# Patient Record
Sex: Female | Born: 1955 | Race: White | Hispanic: No | Smoking: Former smoker
Health system: Southern US, Community
[De-identification: ages and names within clinical notes are randomized; demographics above are authoritative.]

## PROBLEM LIST (undated history)

## (undated) DIAGNOSIS — F419 Anxiety disorder, unspecified: Secondary | ICD-10-CM

## (undated) DIAGNOSIS — J90 Pleural effusion, not elsewhere classified: Secondary | ICD-10-CM

## (undated) DIAGNOSIS — I10 Essential (primary) hypertension: Secondary | ICD-10-CM

## (undated) DIAGNOSIS — I5033 Acute on chronic diastolic (congestive) heart failure: Secondary | ICD-10-CM

## (undated) DIAGNOSIS — I4891 Unspecified atrial fibrillation: Secondary | ICD-10-CM

## (undated) HISTORY — DX: Anxiety disorder, unspecified: F41.9

## (undated) HISTORY — DX: Essential (primary) hypertension: I10

## (undated) HISTORY — PX: WISDOM TOOTH EXTRACTION: SHX21

---
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
## 2019-01-21 DIAGNOSIS — I5033 Acute on chronic diastolic (congestive) heart failure: Secondary | ICD-10-CM

## 2019-01-21 DIAGNOSIS — I4891 Unspecified atrial fibrillation: Secondary | ICD-10-CM

## 2019-01-21 HISTORY — DX: Unspecified atrial fibrillation: I48.91

## 2019-01-21 HISTORY — DX: Acute on chronic diastolic (congestive) heart failure: I50.33

## 2019-02-12 ENCOUNTER — Emergency Department (HOSPITAL_COMMUNITY): Payer: BC Managed Care – PPO

## 2019-02-12 ENCOUNTER — Inpatient Hospital Stay (HOSPITAL_COMMUNITY)
Admission: EM | Admit: 2019-02-12 | Discharge: 2019-02-24 | DRG: 193 | Disposition: A | Discharge status: Home / Self Care | Payer: BC Managed Care – PPO | Attending: Internal Medicine | Admitting: Internal Medicine

## 2019-02-12 ENCOUNTER — Other Ambulatory Visit: Payer: Self-pay

## 2019-02-12 DIAGNOSIS — I110 Hypertensive heart disease with heart failure: Secondary | ICD-10-CM | POA: Diagnosis present

## 2019-02-12 DIAGNOSIS — J811 Chronic pulmonary edema: Secondary | ICD-10-CM | POA: Diagnosis not present

## 2019-02-12 DIAGNOSIS — J189 Pneumonia, unspecified organism: Secondary | ICD-10-CM | POA: Diagnosis present

## 2019-02-12 DIAGNOSIS — I5032 Chronic diastolic (congestive) heart failure: Secondary | ICD-10-CM | POA: Diagnosis present

## 2019-02-12 DIAGNOSIS — F17200 Nicotine dependence, unspecified, uncomplicated: Secondary | ICD-10-CM

## 2019-02-12 DIAGNOSIS — I5033 Acute on chronic diastolic (congestive) heart failure: Secondary | ICD-10-CM | POA: Diagnosis present

## 2019-02-12 DIAGNOSIS — I10 Essential (primary) hypertension: Secondary | ICD-10-CM | POA: Diagnosis not present

## 2019-02-12 DIAGNOSIS — I4891 Unspecified atrial fibrillation: Secondary | ICD-10-CM | POA: Diagnosis present

## 2019-02-12 DIAGNOSIS — R609 Edema, unspecified: Secondary | ICD-10-CM

## 2019-02-12 DIAGNOSIS — Z6837 Body mass index (BMI) 37.0-37.9, adult: Secondary | ICD-10-CM | POA: Diagnosis not present

## 2019-02-12 DIAGNOSIS — F419 Anxiety disorder, unspecified: Secondary | ICD-10-CM | POA: Diagnosis not present

## 2019-02-12 DIAGNOSIS — E669 Obesity, unspecified: Secondary | ICD-10-CM | POA: Diagnosis present

## 2019-02-12 DIAGNOSIS — R0600 Dyspnea, unspecified: Secondary | ICD-10-CM | POA: Diagnosis present

## 2019-02-12 DIAGNOSIS — R0989 Other specified symptoms and signs involving the circulatory and respiratory systems: Secondary | ICD-10-CM | POA: Diagnosis not present

## 2019-02-12 DIAGNOSIS — J181 Lobar pneumonia, unspecified organism: Secondary | ICD-10-CM | POA: Diagnosis not present

## 2019-02-12 DIAGNOSIS — J918 Pleural effusion in other conditions classified elsewhere: Secondary | ICD-10-CM | POA: Diagnosis not present

## 2019-02-12 DIAGNOSIS — Z9114 Patient's other noncompliance with medication regimen: Secondary | ICD-10-CM

## 2019-02-12 DIAGNOSIS — Z20828 Contact with and (suspected) exposure to other viral communicable diseases: Secondary | ICD-10-CM | POA: Diagnosis present

## 2019-02-12 DIAGNOSIS — I2729 Other secondary pulmonary hypertension: Secondary | ICD-10-CM | POA: Diagnosis present

## 2019-02-12 DIAGNOSIS — R601 Generalized edema: Secondary | ICD-10-CM | POA: Diagnosis not present

## 2019-02-12 DIAGNOSIS — I4819 Other persistent atrial fibrillation: Secondary | ICD-10-CM | POA: Diagnosis present

## 2019-02-12 DIAGNOSIS — F17210 Nicotine dependence, cigarettes, uncomplicated: Secondary | ICD-10-CM | POA: Diagnosis present

## 2019-02-12 DIAGNOSIS — J90 Pleural effusion, not elsewhere classified: Secondary | ICD-10-CM | POA: Diagnosis present

## 2019-02-12 DIAGNOSIS — Z79899 Other long term (current) drug therapy: Secondary | ICD-10-CM

## 2019-02-12 DIAGNOSIS — R0602 Shortness of breath: Secondary | ICD-10-CM | POA: Diagnosis not present

## 2019-02-12 DIAGNOSIS — F410 Panic disorder [episodic paroxysmal anxiety] without agoraphobia: Secondary | ICD-10-CM | POA: Diagnosis present

## 2019-02-12 DIAGNOSIS — Z66 Do not resuscitate: Secondary | ICD-10-CM | POA: Diagnosis present

## 2019-02-12 DIAGNOSIS — Z23 Encounter for immunization: Secondary | ICD-10-CM | POA: Diagnosis not present

## 2019-02-12 DIAGNOSIS — I4811 Longstanding persistent atrial fibrillation: Secondary | ICD-10-CM | POA: Diagnosis present

## 2019-02-12 DIAGNOSIS — I272 Pulmonary hypertension, unspecified: Secondary | ICD-10-CM | POA: Diagnosis not present

## 2019-02-12 HISTORY — DX: Pleural effusion, not elsewhere classified: J90

## 2019-02-12 LAB — URINALYSIS, ROUTINE W REFLEX MICROSCOPIC
Bilirubin Urine: NEGATIVE
Glucose, UA: NEGATIVE mg/dL
Hgb urine dipstick: NEGATIVE
Ketones, ur: NEGATIVE mg/dL
Leukocytes,Ua: NEGATIVE
Nitrite: NEGATIVE
Protein, ur: 100 mg/dL — AB
Specific Gravity, Urine: 1.039 — ABNORMAL HIGH (ref 1.005–1.030)
pH: 5 (ref 5.0–8.0)

## 2019-02-12 LAB — CBC WITH DIFFERENTIAL/PLATELET
Abs Immature Granulocytes: 0.08 10*3/uL — ABNORMAL HIGH (ref 0.00–0.07)
Basophils Absolute: 0.1 10*3/uL (ref 0.0–0.1)
Basophils Relative: 0 %
Eosinophils Absolute: 0 10*3/uL (ref 0.0–0.5)
Eosinophils Relative: 0 %
HCT: 55.2 % — ABNORMAL HIGH (ref 36.0–46.0)
Hemoglobin: 17.3 g/dL — ABNORMAL HIGH (ref 12.0–15.0)
Immature Granulocytes: 1 %
Lymphocytes Relative: 10 %
Lymphs Abs: 1.6 10*3/uL (ref 0.7–4.0)
MCH: 30.1 pg (ref 26.0–34.0)
MCHC: 31.3 g/dL (ref 30.0–36.0)
MCV: 96.2 fL (ref 80.0–100.0)
Monocytes Absolute: 1.3 10*3/uL — ABNORMAL HIGH (ref 0.1–1.0)
Monocytes Relative: 8 %
Neutro Abs: 13.3 10*3/uL — ABNORMAL HIGH (ref 1.7–7.7)
Neutrophils Relative %: 81 %
Platelets: 279 10*3/uL (ref 150–400)
RBC: 5.74 MIL/uL — ABNORMAL HIGH (ref 3.87–5.11)
RDW: 15.7 % — ABNORMAL HIGH (ref 11.5–15.5)
WBC: 16.4 10*3/uL — ABNORMAL HIGH (ref 4.0–10.5)
nRBC: 0 % (ref 0.0–0.2)

## 2019-02-12 LAB — COMPREHENSIVE METABOLIC PANEL
ALT: 34 U/L (ref 0–44)
AST: 34 U/L (ref 15–41)
Albumin: 3 g/dL — ABNORMAL LOW (ref 3.5–5.0)
Alkaline Phosphatase: 73 U/L (ref 38–126)
Anion gap: 12 (ref 5–15)
BUN: 18 mg/dL (ref 8–23)
CO2: 25 mmol/L (ref 22–32)
Calcium: 8.6 mg/dL — ABNORMAL LOW (ref 8.9–10.3)
Chloride: 106 mmol/L (ref 98–111)
Creatinine, Ser: 1.16 mg/dL — ABNORMAL HIGH (ref 0.44–1.00)
GFR calc Af Amer: 58 mL/min — ABNORMAL LOW (ref >60)
GFR calc non Af Amer: 50 mL/min — ABNORMAL LOW (ref >60)
Glucose, Bld: 122 mg/dL — ABNORMAL HIGH (ref 70–99)
Potassium: 4 mmol/L (ref 3.5–5.1)
Sodium: 143 mmol/L (ref 135–145)
Total Bilirubin: 1 mg/dL (ref 0.3–1.2)
Total Protein: 6.1 g/dL — ABNORMAL LOW (ref 6.5–8.1)

## 2019-02-12 LAB — SARS CORONAVIRUS 2 BY RT PCR (HOSPITAL ORDER, PERFORMED IN ~~LOC~~ HOSPITAL LAB): SARS Coronavirus 2: NEGATIVE

## 2019-02-12 LAB — POCT I-STAT EG7
Bicarbonate: 28.9 mmol/L — ABNORMAL HIGH (ref 20.0–28.0)
Calcium, Ion: 1.13 mmol/L — ABNORMAL LOW (ref 1.15–1.40)
HCT: 56 % — ABNORMAL HIGH (ref 36.0–46.0)
Hemoglobin: 19 g/dL — ABNORMAL HIGH (ref 12.0–15.0)
O2 Saturation: 90 %
Potassium: 4.1 mmol/L (ref 3.5–5.1)
Sodium: 144 mmol/L (ref 135–145)
TCO2: 31 mmol/L (ref 22–32)
pCO2, Ven: 60.4 mmHg — ABNORMAL HIGH (ref 44.0–60.0)
pH, Ven: 7.289 (ref 7.250–7.430)
pO2, Ven: 68 mmHg — ABNORMAL HIGH (ref 32.0–45.0)

## 2019-02-12 LAB — TROPONIN I (HIGH SENSITIVITY)
Troponin I (High Sensitivity): 40 ng/L — ABNORMAL HIGH (ref <18)
Troponin I (High Sensitivity): 30 ng/L — ABNORMAL HIGH (ref <18)

## 2019-02-12 LAB — BRAIN NATRIURETIC PEPTIDE: B Natriuretic Peptide: 1432.9 pg/mL — ABNORMAL HIGH (ref 0.0–100.0)

## 2019-02-12 LAB — PROTIME-INR
INR: 1.2 (ref 0.8–1.2)
INR: 1.3 — ABNORMAL HIGH (ref 0.8–1.2)
Prothrombin Time: 15.2 seconds (ref 11.4–15.2)
Prothrombin Time: 15.7 seconds — ABNORMAL HIGH (ref 11.4–15.2)

## 2019-02-12 LAB — PHOSPHORUS: Phosphorus: 4.9 mg/dL — ABNORMAL HIGH (ref 2.5–4.6)

## 2019-02-12 LAB — LACTIC ACID, PLASMA
Lactic Acid, Venous: 1.5 mmol/L (ref 0.5–1.9)
Lactic Acid, Venous: 1.2 mmol/L (ref 0.5–1.9)

## 2019-02-12 LAB — TSH: TSH: 1.652 u[IU]/mL (ref 0.350–4.500)

## 2019-02-12 LAB — MAGNESIUM: Magnesium: 2.1 mg/dL (ref 1.7–2.4)

## 2019-02-12 MED ORDER — DILTIAZEM LOAD VIA INFUSION
10.0000 mg | Freq: Once | INTRAVENOUS | Status: AC
  Administered 2019-02-12: 10 mg via INTRAVENOUS
  Filled 2019-02-12: qty 10

## 2019-02-12 MED ORDER — DILTIAZEM HCL-DEXTROSE 100-5 MG/100ML-% IV SOLN (PREMIX)
5.0000 mg/h | INTRAVENOUS | Status: DC
  Administered 2019-02-12: 5 mg/h via INTRAVENOUS
  Administered 2019-02-13 (×3): 15 mg/h via INTRAVENOUS
  Filled 2019-02-12 (×4): qty 100

## 2019-02-12 MED ORDER — SODIUM CHLORIDE 0.9 % IV SOLN
1.0000 g | Freq: Once | INTRAVENOUS | Status: AC
  Administered 2019-02-12: 1 g via INTRAVENOUS
  Filled 2019-02-12: qty 10

## 2019-02-12 MED ORDER — SODIUM CHLORIDE 0.9 % IV SOLN
1.0000 g | INTRAVENOUS | Status: DC
  Administered 2019-02-13 – 2019-02-14 (×2): 1 g via INTRAVENOUS
  Filled 2019-02-12 (×2): qty 10

## 2019-02-12 MED ORDER — SENNOSIDES-DOCUSATE SODIUM 8.6-50 MG PO TABS: 1.0000 | ORAL_TABLET | Freq: Every evening | ORAL | Status: DC | PRN

## 2019-02-12 MED ORDER — FUROSEMIDE 10 MG/ML IJ SOLN
40.0000 mg | Freq: Two times a day (BID) | INTRAMUSCULAR | Status: DC
  Administered 2019-02-13 – 2019-02-23 (×21): 40 mg via INTRAVENOUS
  Filled 2019-02-12 (×21): qty 4

## 2019-02-12 MED ORDER — ACETAMINOPHEN 325 MG PO TABS: 650.0000 mg | ORAL_TABLET | Freq: Four times a day (QID) | ORAL | Status: DC | PRN

## 2019-02-12 MED ORDER — FUROSEMIDE 20 MG PO TABS
40.0000 mg | ORAL_TABLET | Freq: Once | ORAL | Status: DC
  Filled 2019-02-12: qty 2

## 2019-02-12 MED ORDER — ACETAMINOPHEN 650 MG RE SUPP: 650.0000 mg | Freq: Four times a day (QID) | RECTAL | Status: DC | PRN

## 2019-02-12 MED ORDER — IOHEXOL 350 MG/ML SOLN
75.0000 mL | Freq: Once | INTRAVENOUS | Status: AC | PRN
  Administered 2019-02-12: 75 mL via INTRAVENOUS

## 2019-02-12 MED ORDER — ENOXAPARIN SODIUM 40 MG/0.4ML ~~LOC~~ SOLN
40.0000 mg | SUBCUTANEOUS | Status: DC
  Administered 2019-02-13 – 2019-02-14 (×2): 40 mg via SUBCUTANEOUS
  Filled 2019-02-12 (×2): qty 0.4

## 2019-02-12 MED ORDER — FUROSEMIDE 10 MG/ML IJ SOLN
40.0000 mg | Freq: Once | INTRAMUSCULAR | Status: AC
  Administered 2019-02-12: 40 mg via INTRAVENOUS
  Filled 2019-02-12: qty 4

## 2019-02-12 MED ORDER — AZITHROMYCIN 500 MG PO TABS
500.0000 mg | ORAL_TABLET | Freq: Every day | ORAL | Status: DC
  Administered 2019-02-13 – 2019-02-14 (×2): 500 mg via ORAL
  Filled 2019-02-12 (×2): qty 1

## 2019-02-12 MED ORDER — DILTIAZEM HCL 25 MG/5ML IV SOLN
20.0000 mg | Freq: Once | INTRAVENOUS | Status: AC
  Administered 2019-02-12: 20 mg via INTRAVENOUS
  Filled 2019-02-12: qty 5

## 2019-02-12 MED ORDER — SODIUM CHLORIDE 0.9 % IV SOLN
500.0000 mg | Freq: Once | INTRAVENOUS | Status: AC
  Administered 2019-02-12: 21:00:00 500 mg via INTRAVENOUS
  Filled 2019-02-12: qty 500

## 2019-02-12 MED ORDER — ALBUTEROL SULFATE (2.5 MG/3ML) 0.083% IN NEBU: 2.5000 mg | INHALATION_SOLUTION | Freq: Four times a day (QID) | RESPIRATORY_TRACT | Status: DC | PRN

## 2019-02-12 NOTE — ED Provider Notes (Signed)
[facility] EMERGENCY DEPARTMENT Provider Note   CSN: [id number] Arrival date & time: 02/12/19  1534     History   Chief Complaint Chief Complaint  Patient presents with  . Atrial Fibrillation  . Shortness of Breath    HPI Sharon Acosta is a 63 y.o. female.     HPI Patient reports she is been getting swelling in her ankles and feet for several months.  She has been sitting a lot while working 1 of her jobs.  She does work 2 jobs.  This is gradually been increasing and now she has swelling that includes her abdomen and left breast.  She reports that she is gotten very winded with any activity.  She reports after walking only 10 feet she gets short of breath and has to rest.  She denies she is experiencing any chest pain.  She has not had syncope.  She has not had fever or cough.  She reports that she was prescribed blood pressure medications about a year ago but because of being busy with her jobs once she ran out she did not make an appointment to go back to the doctor.  He finally went back today and was referred to the emergency department.  She does smoke.  She has had to cut back over the past week or 2.  He has no known history of atrial fibrillation or any problems with her heart that she was aware of. No past medical history on file.  There are no active problems to display for this patient.      OB History   No obstetric history on file.      Home Medications    Prior to Admission medications   Not on File    Family History No family history on file.  Social History Social History   Tobacco Use  . Smoking status: Not on file  Substance Use Topics  . Alcohol use: Not on file  . Drug use: Not on file     Allergies   Patient has no known allergies.   Review of Systems Review of Systems 10 Systems reviewed and are negative for acute change except as noted in the HPI.   Physical Exam Updated Vital Signs BP (!) 133/115   Pulse  (!) 31   Temp 98.4 F (36.9 C) (Oral)   Resp (!) 29   Ht 5\' 7"  (1.702 m)   Wt 125.6 kg   SpO2 97%   BMI 43.38 kg/m   Physical Exam Constitutional:      Comments: Alert and nontoxic.  Mild to moderate increased work of breathing at rest.  Speaking in full sentences.  HENT:     Head: Normocephalic and atraumatic.     Mouth/Throat:     Mouth: Mucous membranes are moist.     Pharynx: Oropharynx is clear.  Eyes:     Extraocular Movements: Extraocular movements intact.  Neck:     Musculoskeletal: Neck supple.     Comments: No thyromegaly Cardiovascular:     Comments: Tachycardia irregularly irregular Pulmonary:     Comments: Crackles on the left side occasional expiratory wheeze on the right with decreased breath sounds at the bases.  Left breast has mild diffuse pitting of the lower belly of the breast.  Right breast does not have this.  No obvious masses. Abdominal:     Comments: Abdomen is nontender.  Patient has slight pitting edema of the lower abdominal wall.  Musculoskeletal:  Comments: 3+ pitting edema bilateral lower extremities to above the knees.  Skin:    General: Skin is warm.     Comments: Patient is slightly diaphoretic on the head and neck.  Neurological:     General: No focal deficit present.     Mental Status: She is oriented to person, place, and time.     Coordination: Coordination normal.  Psychiatric:        Mood and Affect: Mood normal.      ED Treatments / Results  Labs (all labs ordered are listed, but only abnormal results are displayed) Labs Reviewed  CULTURE, BLOOD (ROUTINE X 2)  CULTURE, BLOOD (ROUTINE X 2)  COMPREHENSIVE METABOLIC PANEL  BRAIN NATRIURETIC PEPTIDE  LACTIC ACID, PLASMA  LACTIC ACID, PLASMA  CBC WITH DIFFERENTIAL/PLATELET  PROTIME-INR  URINALYSIS, ROUTINE W REFLEX MICROSCOPIC  BLOOD GAS, VENOUS  MAGNESIUM  PHOSPHORUS  TSH  TROPONIN I (HIGH SENSITIVITY)    EKG EKG Interpretation  Date/Time:  Thursday February 12 2019 15:38:59 EDT Ventricular Rate:  135 PR Interval:    QRS Duration: 80 QT Interval:  302 QTC Calculation: 453 R Axis:   4 Text Interpretation:  Atrial fibrillation Borderline low voltage, extremity leads agree, no ld comp Confirmed by Arby BarrettePfeiffer, Zalayah Pizzuto [phone]([id number]) on 02/12/2019 3:48:53 PM   Radiology No results found.  Procedures Procedures (including critical care time) CRITICAL CARE Performed by: Arby BarretteMarcy Fedor Kazmierski   Total critical care time: 30 minutes  Critical care time was exclusive of separately billable procedures and treating other patients.  Critical care was necessary to treat or prevent imminent or life-threatening deterioration.  Critical care was time spent personally by me on the following activities: development of treatment plan with patient and/or surrogate as well as nursing, discussions with consultants, evaluation of patient's response to treatment, examination of patient, obtaining history from patient or surrogate, ordering and performing treatments and interventions, ordering and review of laboratory studies, ordering and review of radiographic studies, pulse oximetry and re-evaluation of patient's condition. Medications Ordered in ED Medications - No data to display   Initial Impression / Assessment and Plan / ED Course  I have reviewed the triage vital signs and the nursing notes.  Pertinent labs & imaging results that were available during my care of the patient were reviewed by me and considered in my medical decision making (see chart for details).  Clinical Course as of Feb 16 721  Thu Feb 12, 2019  2037 Consult: Internal medicine for admission.   [MP]    Clinical Course User Index [MP] Arby BarrettePfeiffer, Gavynn Duvall, MD      Patient presents with atrial fibrillation rapid response and peripheral edema.  Has been incrementally developing.  Chest x-ray showed moderately large effusion and consolidation of left lower lobe.  CT scan obtained to rule out PE.  No PE  present.  Will initiate treatment for community-acquired pneumonia and atrial fibrillation with peripheral edema suspect element of CHF.  Antibiotics initiated.  Cardizem bolus initiated admission to medical service.  Final Clinical Impressions(s) / ED Diagnoses   Final diagnoses:  Pneumonia  Atrial fibrillation with rapid ventricular response (HCC)  Dyspnea, unspecified type  Edema, unspecified type    ED Discharge Orders    None       Arby BarrettePfeiffer, Marvel Mcphillips, MD 02/17/19 [id number]

## 2019-02-12 NOTE — H&P (Addendum)
Date: 02/12/2019               Patient Name:  Sharon Acosta MRN: [id number]  DOB: 06-19-1956 Age / Sex: 63 y.o., female   PCP: [facility]         Medical Service: Internal Medicine Teaching Service         Attending Physician: Dr. Arby BarrettePfeiffer, Marcy, MD    First Contact: Dr. Thurmon FairSteen, Jeff Pager: [phone]  Second Contact: Dr. Ginette OttoMelvin, Alec Pager: [phone]       After Hours (After 5p/  First Contact Pager: [phone]  weekends / holidays): Second Contact Pager: [phone]   Chief Complaint: Shortness of breath  History of Present Illness:  Sharon Acosta is a 63 yo F w/ PMH of HTN [facility] w/ complaints of progressive leg swelling and shortness of breath. She states she was in her usual state of health until 6 months ago when she began to notice dyspnea on exertion without any obvious inciting event.  She mentions that she was diagnosed with hypertension and was started on some antihypertensives but she has not been taking them as prescribed.  She states that at work she has been having significant stressors due to 1 of her coworkers becoming sick and passing away and has been having longer hours with more responsibilities.  She mentions that at baseline she was able to ambulate without difficulty greater than 3 blocks but over the last 6 months she has been having worsening dyspnea on exertion and now has difficulty going to the bathroom.  She states she has not been seeing a 'regular doctor' due to being busy with work. She did establish care with [facility] due to her progressive symptoms who recommended she go to the ED for evaluation.  On review of systems, she mentions endorsing worsening orthopnea, palpitations, and episodes described as 'panic attacks.' She mentions sick contact at work but states she wears appropriate face covering. Denies any fevers, chills, nausea, vomiting, diarrhea, productive sputum. Denies any urinary frequency, dysuria, or  uregency.  In the ED, she was found to have left lower lobe consolidation with leukocytosis as well as BNP of 1432.9 She was started on azithromycin and ceftriaxone. She was also found to have A.fib with RVR and she was started on diltiazem drip. IMTS was consulted for admission.  Meds:  Current Meds  Medication Sig  . ASHWAGANDHA PO Take 1 tablet by mouth daily with breakfast. Stress/Anxiety formulation  . Magnesium 250 MG TABS Take 250 mg by mouth daily with breakfast.  . naproxen sodium (ALEVE) 220 MG tablet Take 220-440 mg by mouth 2 (two) times daily as needed (for headaches or pain).  . Potassium 99 MG TABS Take 99 mg by mouth daily with breakfast.  . pyridOXINE (VITAMIN B-6) 100 MG tablet Take 100 mg by mouth daily with breakfast.   Allergies: Allergies as of 02/12/2019  . (No Known Allergies)   No past medical history on file.  Family History:  Denies any significant cardiac family history  Social History: Works as Production designer, theatre/television/filmmanager at [employer]. Has good family support. 1/2 pack daily smoker. Denies any illicit substance use or alcohol use. Eats a lot of fast food.  Review of Systems: A complete ROS was negative except as per HPI.   Physical Exam: Blood pressure (!) 138/119, pulse 66, temperature 98.4 F (36.9 C), temperature source Oral, resp. rate (!) 24, height 5\' 7"  (1.702 m), weight 125.6 kg, SpO2 97 %.  Gen: Well-developed, obese, NAD Neck: supple, ROM intact, + JVD CV: Irregularly irregular, S1, S2 normal Pulm: Distant breath sound, basilar rales on R side, LLL dullness to percussion  Abd: Soft, BS+, Distended with abdominal wall edema Extm: ROM intact, Peripheral pulses intact, 3+ pitting edema up to thighs Skin: Dry, Warm, normal turgor, erythematous left breast without edema or tenderness  EKG: personally reviewed my interpretation is irregularly irregular, normal axis, no ST changes, no T-wave inversions.  CXR: personally reviewed my interpretation is left lower  lobe consolidation and left pleural effusion.   Assessment & Plan by Problem: Active Problems:   * No active hospital problems. *  Sharon Acosta is 63 yo F w/ PMH of HTN admit for anasarca with dyspnea likely due to heart failure exacerbation with community acquired pneumonia. Disease script of progressively worsening edema with dyspnea on exertion with poor health follow up suggests undiagnosed heart failure with inadequate treatment as likely source of her symptoms. Her chest X-ray is concerning for pneumonia as bilateral consolidation / pleural effusion would be expected and she may also have concurrent community acquired pneumonia. Dry weight is unknown but significantly hypervolemic on exam. She will need inpatient admission for further work-up and IV diuresis.  Dyspnea 2/2 CAP vs Pulmonary Edema X-ray w/ LLL lobar consolidation. CTA chest w/ dense LLL consolidation with ground-glass opacities. WBC 16.4 w/ left shift Currently satting 99 on 2L. No oxygen use at home. Started on azithromycin and ceftriaxone in ED. - C/w Azithromycin 500mg  daily, ceftriaxone 1mg  IV daily (day 1 of 5) - Keep O2 sat >88 - Diuresis as described below - Will need thoracentesis for unilateral pleural effusion  Anasarca 2/2 likely undiagnosed heart failure BNP 1433 on admit. No prior cardic hx but has not followed with PCP in the past. Class 4 NYHA with symptomatic dyspnea at rest. Unclear dry weight. Not on diuretics at home. Creatinine 1.16. - Echocardiogram - Start IV Furosemide 40mg  BID - Trend BMP - Mag level - Strict I&Os - Daily Weights - Fluid restriction - Keep O2 sat >88 - Replenish K as needed >4.0  A.Fib w/ RVR EKG with irregularly irregular rhythm with HR >130. With concurrent anasarca, she would be high risk for developing A.fib if she also has R. Atrial enlargement. CHAD-VAS2 score of 2 or 3 depending on results of echo. Currently on diltiazem drip from ED - C/w dilt drip - Monitor bp for  hypotension - Will require anti-coagulation initiation prior to discharge  HTN Admit BP 129/109. Currently started on dilt drip for A.fib - Monitor  DVT prophx: Lovenox Diet: Low salt Bowel: Senokot Code: DNR  Dispo: Admit patient to Inpatient with expected length of stay greater than 2 midnights.  Signed: Mosetta Anis, MD 02/12/2019, 8:43 PM  Pager: [phone]

## 2019-02-12 NOTE — ED Triage Notes (Signed)
Pt from NP office. Pt has been having pitting edema in legs and feet X1 week. Pt reports edema in belly and breast. EMS states pt rate was 150-175. Gave 10mg  metoprolol with some relief 110-140. Pt sob with exertion

## 2019-02-12 NOTE — ED Notes (Signed)
Rn attempted to call report. Asked to call back. 

## 2019-02-12 NOTE — ED Notes (Signed)
ED TO INPATIENT HANDOFF REPORT  ED Nurse Name and Phone #: [phone] Shawna Orleans, RN  S Name/Age/Gender Sharon Acosta 63 y.o. female Room/Bed: 025C/025C  Code Status   Code Status: DNR  Home/SNF/Other Home Patient oriented to: self, place, time and situation Is this baseline? Yes   Triage Complete: Triage complete  Chief Complaint new onset afib rvr  Triage Note Pt from NP office. Pt has been having pitting edema in legs and feet X1 week. Pt reports edema in belly and breast. EMS states pt rate was 150-175. Gave  metoprolol with some relief 110-140. Pt sob with exertion    Allergies No Known Allergies  Level of Care/Admitting Diagnosis ED Disposition    ED Disposition Condition Comment   Admit  Hospital Area: [facility] [[id number]]  Level of Care: Telemetry Medical [104]  Covid Evaluation: Confirmed COVID Negative  Diagnosis: Dyspnea [147829]  Admitting Physician: Nena Polio  Attending Physician: Nena Polio  Estimated length of stay: past midnight tomorrow  Certification:: I certify this patient will need inpatient services for at least 2 midnights  PT Class (Do Not Modify): Inpatient [101]  PT Acc Code (Do Not Modify): Private [1]       B Medical/Surgery History No past medical history on file.    A IV Location/Drains/Wounds Patient Lines/Drains/Airways Status   Active Line/Drains/Airways    Name:   Placement date:   Placement time:   Site:   Days:   Peripheral IV 02/12/19 Left Antecubital   02/12/19    1900    Antecubital   less than 1   Peripheral IV 02/12/19 Right Wrist   02/12/19    2110    Wrist   less than 1          Intake/Output Last 24 hours No intake or output data in the 24 hours ending 02/12/19 2206  Labs/Imaging Results for orders placed or performed during the hospital encounter of 02/12/19 (from the past 48 hour(s))  Comprehensive metabolic panel     Status: Abnormal   Collection Time: 02/12/19   4:03 PM  Result Value Ref Range   Sodium 143 135 - 145 mmol/L   Potassium 4.0 3.5 - 5.1 mmol/L   Chloride 106 98 - 111 mmol/L   CO2 25 22 - 32 mmol/L   Glucose, Bld 122 (H) 70 - 99 mg/dL   BUN 18 8 - 23 mg/dL   Creatinine, Ser 5.62 (H) 0.44 - 1.00 mg/dL   Calcium 8.6 (L) 8.9 - 10.3 mg/dL   Total Protein 6.1 (L) 6.5 - 8.1 g/dL   Albumin 3.0 (L) 3.5 - 5.0 g/dL   AST 34 15 - 41 U/L   ALT 34 0 - 44 U/L   Alkaline Phosphatase 73 38 - 126 U/L   Total Bilirubin 1.0 0.3 - 1.2 mg/dL   GFR calc non Af Amer 50 (L) >60 mL/min   GFR calc Af Amer 58 (L) >60 mL/min   Anion gap 12 5 - 15    Comment: Performed at [facility], [address], [place], [place] [address]  Brain natriuretic peptide     Status: Abnormal   Collection Time: 02/12/19  4:03 PM  Result Value Ref Range   B Natriuretic Peptide 1,432.9 (H) 0.0 - 100.0 pg/mL    Comment: Performed at [facility], [address] [place], [place] [address]  Troponin I (High Sensitivity)     Status: Abnormal   Collection  Time: 02/12/19  4:03 PM  Result Value Ref Range   Troponin I (High Sensitivity) 40 (H) <18 ng/L    Comment: (NOTE) Elevated high sensitivity troponin I (hsTnI) values and significant  changes across serial measurements may suggest ACS but many other  chronic and acute conditions are known to elevate hsTnI results.  Refer to the "Links" section for chest pain algorithms and additional  guidance. Performed at [facility], [place] [address] [place], [place] [address]   CBC with Differential     Status: Abnormal   Collection Time: 02/12/19  4:03 PM  Result Value Ref Range   WBC 16.4 (H) 4.0 - 10.5 K/uL   RBC 5.74 (H) 3.87 - 5.11 MIL/uL   Hemoglobin 17.3 (H) 12.0 - 15.0 g/dL   HCT 55.2 (H) 36.0 - 46.0 %   MCV 96.2 80.0 - 100.0 fL   MCH 30.1 26.0 - 34.0 pg   MCHC 31.3 30.0 - 36.0 g/dL   RDW 15.7 (H) 11.5 - 15.5 %   Platelets 279 150 - 400 K/uL   nRBC 0.0 0.0 - 0.2 %   Neutrophils Relative % 81 %    Neutro Abs 13.3 (H) 1.7 - 7.7 K/uL   Lymphocytes Relative 10 %   Lymphs Abs 1.6 0.7 - 4.0 K/uL   Monocytes Relative 8 %   Monocytes Absolute 1.3 (H) 0.1 - 1.0 K/uL   Eosinophils Relative 0 %   Eosinophils Absolute 0.0 0.0 - 0.5 K/uL   Basophils Relative 0 %   Basophils Absolute 0.1 0.0 - 0.1 K/uL   Immature Granulocytes 1 %   Abs Immature Granulocytes 0.08 (H) 0.00 - 0.07 K/uL    Comment: Performed at [place] [address], [place], [place] [address]  Protime-INR     Status: None   Collection Time: 02/12/19  4:03 PM  Result Value Ref Range   Prothrombin Time 15.2 11.4 - 15.2 seconds    Comment: QUESTIONABLE RESULTS, RECOMMEND RECOLLECT TO VERIFY HCT >QR RECOLLECT TO VERIFY    INR 1.2 0.8 - 1.2    Comment: QUESTIONABLE RESULTS, RECOMMEND RECOLLECT TO VERIFY HCT >55 RECOLLECT TO VERIFY  (NOTE) INR goal varies based on device and disease states. Performed at [facility], [place] [address], [place], [place] [address]   Magnesium     Status: None   Collection Time: 02/12/19  4:03 PM  Result Value Ref Range   Magnesium 2.1 1.7 - 2.4 mg/dL    Comment: Performed at [place] [address], [place], [place] [address]  Phosphorus     Status: Abnormal   Collection Time: 02/12/19  4:03 PM  Result Value Ref Range   Phosphorus 4.9 (H) 2.5 - 4.6 mg/dL    Comment: Performed at [place] [address] [place], [place] [address]  TSH     Status: None   Collection Time: 02/12/19  4:05 PM  Result Value Ref Range   TSH 1.652 0.350 - 4.500 uIU/mL    Comment: Performed by a 3rd Generation assay with a functional sensitivity of <=0.01 uIU/mL. Performed at [facility], [place] [address] [place], [place] [address]   POCT I-Stat EG7     Status: Abnormal   Collection Time: 02/12/19  4:08 PM  Result Value Ref Range   pH, Ven 7.289 7.250 - 7.430   pCO2, Ven 60.4 (H) 44.0 - 60.0 mmHg   pO2, Ven 68.0 (H) 32.0 -  45.0 mmHg   Bicarbonate 28.9 (H) 20.0 -  28.0 mmol/L   TCO2 31 22 - 32 mmol/L   O2 Saturation 90.0 %   Sodium 144 135 - 145 mmol/L   Potassium 4.1 3.5 - 5.1 mmol/L   Calcium, Ion 1.13 (L) 1.15 - 1.40 mmol/L   HCT 56.0 (H) 36.0 - 46.0 %   Hemoglobin 19.0 (H) 12.0 - 15.0 g/dL   Patient temperature HIDE    Sample type VENOUS   Lactic acid, plasma     Status: None   Collection Time: 02/12/19  4:45 PM  Result Value Ref Range   Lactic Acid, Venous 1.5 0.5 - 1.9 mmol/L    Comment: Performed at [facility], [address] [place], [place] [address]  Protime-INR     Status: Abnormal   Collection Time: 02/12/19  5:00 PM  Result Value Ref Range   Prothrombin Time 15.7 (H) 11.4 - 15.2 seconds    Comment: SPECIMEN COLLECTED IN ANTICOAGULANT ADJUSTED TUBE DUE TO ELEVATED HEMATOCRIT   INR 1.3 (H) 0.8 - 1.2    Comment: (NOTE) INR goal varies based on device and disease states. Performed at [facility], [address] [place], [place] [address]   SARS Coronavirus 2 (CEPHEID - Performed in [facility] hospital lab), Hosp Order     Status: None   Collection Time: 02/12/19  5:06 PM   Specimen: Nasopharyngeal Swab  Result Value Ref Range   SARS Coronavirus 2 NEGATIVE NEGATIVE    Comment: (NOTE) If result is NEGATIVE SARS-CoV-2 target nucleic acids are NOT DETECTED. The SARS-CoV-2 RNA is generally detectable in upper and lower  respiratory specimens during the acute phase of infection. The lowest  concentration of SARS-CoV-2 viral copies this assay can detect is 250  copies / mL. A negative result does not preclude SARS-CoV-2 infection  and should not be used as the sole basis for treatment or other  patient management decisions.  A negative result may occur with  improper specimen collection / handling, submission of specimen other  than nasopharyngeal swab, presence of viral mutation(s) within the  areas targeted by this assay, and inadequate number of viral copies  (<250 copies / mL). A negative result must be  combined with clinical  observations, patient history, and epidemiological information. If result is POSITIVE SARS-CoV-2 target nucleic acids are DETECTED. The SARS-CoV-2 RNA is generally detectable in upper and lower  respiratory specimens dur ing the acute phase of infection.  Positive  results are indicative of active infection with SARS-CoV-2.  Clinical  correlation with patient history and other diagnostic information is  necessary to determine patient infection status.  Positive results do  not rule out bacterial infection or co-infection with other viruses. If result is PRESUMPTIVE POSTIVE SARS-CoV-2 nucleic acids MAY BE PRESENT.   A presumptive positive result was obtained on the submitted specimen  and confirmed on repeat testing.  While 2019 novel coronavirus  (SARS-CoV-2) nucleic acids may be present in the submitted sample  additional confirmatory testing may be necessary for epidemiological  and / or clinical management purposes  to differentiate between  SARS-CoV-2 and other Sarbecovirus currently known to infect humans.  If clinically indicated additional testing with an alternate test  methodology [phone]) is advised. The SARS-CoV-2 RNA is generally  detectable in upper and lower respiratory sp ecimens during the acute  phase of infection. The expected result is Negative. Fact Sheet for Patients:  BoilerBrush.com.cy Fact Sheet for Healthcare Providers: https://pope.com/  This test is not yet approved or cleared by the [place] FDA and has been authorized for detection and/or diagnosis of SARS-CoV-2 by FDA under an Emergency Use Authorization (EUA).  This EUA will remain in effect (meaning this test can be used) for the duration of the COVID-19 declaration under Section 564(b)(1) of the Act, 21 U.S.C. section 360bbb-3(b)(1), unless the authorization is terminated or revoked sooner. Performed at [facility], [address] [place], [place] [address]   Lactic acid, plasma     Status: None   Collection Time: 02/12/19  6:03 PM  Result Value Ref Range   Lactic Acid, Venous 1.2 0.5 - 1.9 mmol/L    Comment: Performed at [facility], [address] [place], [place] [address]  Troponin I (High Sensitivity)     Status: Abnormal   Collection Time: 02/12/19  6:03 PM  Result Value Ref Range   Troponin I (High Sensitivity) 30 (H) <18 ng/L    Comment: (NOTE) Elevated high sensitivity troponin I (hsTnI) values and significant  changes across serial measurements may suggest ACS but many other  chronic and acute conditions are known to elevate hsTnI results.  Refer to the "Links" section for chest pain algorithms and additional  guidance. Performed at [facility], [address] [place], [place] [address]   Urinalysis, Routine w reflex microscopic     Status: Abnormal   Collection Time: 02/12/19  8:15 PM  Result Value Ref Range   Color, Urine AMBER (A) YELLOW    Comment: BIOCHEMICALS MAY BE AFFECTED BY COLOR   APPearance HAZY (A) CLEAR   Specific Gravity, Urine 1.039 (H) 1.005 - 1.030   pH 5.0 5.0 - 8.0   Glucose, UA NEGATIVE NEGATIVE mg/dL   Hgb urine dipstick NEGATIVE NEGATIVE   Bilirubin Urine NEGATIVE NEGATIVE   Ketones, ur NEGATIVE NEGATIVE mg/dL   Protein, ur 213100 (A) NEGATIVE mg/dL   Nitrite NEGATIVE NEGATIVE   Leukocytes,Ua NEGATIVE NEGATIVE   RBC / HPF 0-5 0 - 5 RBC/hpf   WBC, UA 0-5 0 - 5 WBC/hpf   Bacteria, UA RARE (A) NONE SEEN   Squamous Epithelial / LPF 0-5 0 - 5   Mucus PRESENT    Hyaline Casts, UA PRESENT     Comment: Performed at [facility], [address] [place], [place] [address]   Ct Angio Chest Pe W/cm &/or Wo Cm  Result Date: 02/12/2019 CLINICAL DATA:  Acute onset of shortness of breath on exertion and pitting edema at the lower extremities. Generalized chest pain. EXAM: CT ANGIOGRAPHY CHEST WITH CONTRAST TECHNIQUE: Multidetector CT imaging of  the chest was performed using the standard protocol during bolus administration of intravenous contrast. Multiplanar CT image reconstructions and MIPs were obtained to evaluate the vascular anatomy. CONTRAST:  75mL OMNIPAQUE IOHEXOL 350 MG/ML SOLN COMPARISON:  Chest radiograph performed earlier today at 4:11 p.m. FINDINGS: Cardiovascular: There is no definite evidence of pulmonary embolus. Evaluation for pulmonary embolus at the left lower lobe is somewhat suboptimal due to surrounding airspace opacification. The heart is borderline prominent. Mild calcification is noted at the aortic arch. Mediastinum/Nodes: No definite mediastinal lymphadenopathy is seen. No pericardial effusion is identified. The visualized portions of the thyroid gland are unremarkable. No axillary lymphadenopathy is seen. Lungs/Pleura: There is a small to moderate left-sided pleural effusion. There is dense consolidation of the left lower lobe, and scattered ground-glass airspace opacity within the left lung. This may reflect asymmetric pulmonary edema  or possibly pneumonia. The left lung appears clear. No pneumothorax is seen. Upper Abdomen: The visualized portions of the liver are unremarkable. The spleen appears diminutive. Musculoskeletal: No acute osseous abnormalities are identified. The visualized musculature is unremarkable in appearance. Soft tissue edema is noted along the anterior chest wall, left breast and mid back. Review of the MIP images confirms the above findings. IMPRESSION: 1. No definite evidence of pulmonary embolus. 2. Dense consolidation of the left lower lobe, scattered ground-glass airspace opacity at the left lung, and small to moderate left-sided pleural effusion. This may reflect asymmetric pulmonary edema or possibly pneumonia. 3. Soft tissue edema along the anterior chest wall, left breast and mid back. Electronically Signed   By: Roanna RaiderJeffery  Chang M.D.   On: 02/12/2019 20:00   Dg Chest Port 1 View  Result Date:  02/12/2019 CLINICAL DATA:  Pt from NP office. Pt has been having pitting edema in legs and feet X1 week. Pt reports edema in belly, legs and breast. Pt c/o SOB w/ exertion EXAM: PORTABLE CHEST 1 VIEW COMPARISON:  None. FINDINGS: Cardiac silhouette partly obscured, likely mildly enlarged. No mediastinal masses. Moderate to large left pleural effusion opacifying the left hemithorax to the level of the left hilum. No right pleural effusion. Lungs show prominent bronchovascular markings. There is opacity adjacent to the left pleural effusion consistent with atelectasis. No convincing pneumonia and no overt pulmonary edema. No pneumothorax. Skeletal structures are grossly intact. IMPRESSION: 1. Moderate to large left pleural effusion with associated atelectasis. 2. No convincing pneumonia or pulmonary edema. Electronically Signed   By: Amie Portlandavid  Ormond M.D.   On: 02/12/2019 16:45    Pending Labs Unresulted Labs (From admission, onward)    Start     Ordered   02/13/19 0500  Basic metabolic panel  Tomorrow morning,   R     02/12/19 2157   02/13/19 0500  CBC  Tomorrow morning,   R     02/12/19 2157   02/12/19 2153  HIV antibody (Routine Testing)  Once,   STAT     02/12/19 2157   02/12/19 2139  Procalcitonin - Baseline  Add-on,   AD     02/12/19 2138   02/12/19 1604  Blood gas, venous  Once,   STAT     02/12/19 1604   02/12/19 1604  Culture, blood (routine x 2)  BLOOD CULTURE X 2,   STAT     02/12/19 1604          Vitals/Pain Today's Vitals   02/12/19 1950 02/12/19 2030 02/12/19 2100 02/12/19 2115  BP:  (!) 141/118  (!) 152/117  Pulse:  90    Resp:  (!) 28 19 20   Temp:      TempSrc:      SpO2:  96%    Weight:      Height:      PainSc: 0-No pain       Isolation Precautions No active isolations  Medications Medications  azithromycin (ZITHROMAX) 500 mg in sodium chloride 0.9 % 250 mL IVPB (500 mg Intravenous New Bag/Given 02/12/19 2121)  diltiazem (CARDIZEM) 1 mg/mL load via infusion 10 mg  (10 mg Intravenous Bolus from Bag 02/12/19 2114)    And  diltiazem (CARDIZEM) 100 mg in dextrose 5% 100mL (1 mg/mL) infusion (5 mg/hr Intravenous New Bag/Given 02/12/19 2111)  enoxaparin (LOVENOX) injection 40 mg (has no administration in time range)  acetaminophen (TYLENOL) tablet 650 mg (has no administration in time range)    Or  acetaminophen (TYLENOL) suppository 650 mg (has no administration in time range)  senna-docusate (Senokot-S) tablet 1 tablet (has no administration in time range)  albuterol (PROVENTIL) (2.5 MG/3ML) 0.083% nebulizer solution 2.5 mg (has no administration in time range)  furosemide (LASIX) injection 40 mg (has no administration in time range)  diltiazem (CARDIZEM) injection 20 mg (20 mg Intravenous Given 02/12/19 1900)  iohexol (OMNIPAQUE) 350 MG/ML injection 75 mL (75 mLs Intravenous Contrast Given 02/12/19 1940)  cefTRIAXone (ROCEPHIN) 1 g in sodium chloride 0.9 % 100 mL IVPB (0 g Intravenous Stopped 02/12/19 2121)    Mobility walks Low fall risk   Focused Assessments Cardiac Assessment Handoff:  Cardiac Rhythm: Atrial fibrillation No results found for: CKTOTAL, CKMB, CKMBINDEX, TROPONINI No results found for: DDIMER Does the Patient currently have chest pain? No      R Recommendations: See Admitting Provider Note  Report given to:   Additional Notes:

## 2019-02-13 ENCOUNTER — Encounter (HOSPITAL_COMMUNITY): Payer: Self-pay

## 2019-02-13 ENCOUNTER — Inpatient Hospital Stay (HOSPITAL_COMMUNITY): Payer: BC Managed Care – PPO

## 2019-02-13 DIAGNOSIS — J90 Pleural effusion, not elsewhere classified: Secondary | ICD-10-CM

## 2019-02-13 DIAGNOSIS — R0989 Other specified symptoms and signs involving the circulatory and respiratory systems: Secondary | ICD-10-CM

## 2019-02-13 DIAGNOSIS — R0602 Shortness of breath: Secondary | ICD-10-CM

## 2019-02-13 LAB — BODY FLUID CELL COUNT WITH DIFFERENTIAL
Eos, Fluid: 0 %
Lymphs, Fluid: 65 %
Monocyte-Macrophage-Serous Fluid: 0 % — ABNORMAL LOW (ref 50–90)
Neutrophil Count, Fluid: 35 % — ABNORMAL HIGH (ref 0–25)
Total Nucleated Cell Count, Fluid: 1337 cu mm — ABNORMAL HIGH (ref 0–1000)

## 2019-02-13 LAB — CBC
HCT: 53.9 % — ABNORMAL HIGH (ref 36.0–46.0)
Hemoglobin: 17.4 g/dL — ABNORMAL HIGH (ref 12.0–15.0)
MCH: 30.3 pg (ref 26.0–34.0)
MCHC: 32.3 g/dL (ref 30.0–36.0)
MCV: 93.9 fL (ref 80.0–100.0)
Platelets: 263 10*3/uL (ref 150–400)
RBC: 5.74 MIL/uL — ABNORMAL HIGH (ref 3.87–5.11)
RDW: 15.6 % — ABNORMAL HIGH (ref 11.5–15.5)
WBC: 16.6 10*3/uL — ABNORMAL HIGH (ref 4.0–10.5)
nRBC: 0 % (ref 0.0–0.2)

## 2019-02-13 LAB — PROTEIN / CREATININE RATIO, URINE
Creatinine, Urine: <10 mg/dL
Total Protein, Urine: <6 mg/dL

## 2019-02-13 LAB — LACTATE DEHYDROGENASE, PLEURAL OR PERITONEAL FLUID: LD, Fluid: 138 U/L — ABNORMAL HIGH (ref 3–23)

## 2019-02-13 LAB — BASIC METABOLIC PANEL
Anion gap: 10 (ref 5–15)
BUN: 11 mg/dL (ref 8–23)
CO2: 13 mmol/L — ABNORMAL LOW (ref 22–32)
Calcium: 8.7 mg/dL — ABNORMAL LOW (ref 8.9–10.3)
Chloride: 120 mmol/L — ABNORMAL HIGH (ref 98–111)
Creatinine, Ser: 0.6 mg/dL (ref 0.44–1.00)
GFR calc Af Amer: >60 mL/min (ref >60)
GFR calc non Af Amer: >60 mL/min (ref >60)
Glucose, Bld: 104 mg/dL — ABNORMAL HIGH (ref 70–99)
Potassium: 3.1 mmol/L — ABNORMAL LOW (ref 3.5–5.1)
Sodium: 143 mmol/L (ref 135–145)

## 2019-02-13 LAB — LIPID PANEL
Cholesterol: 97 mg/dL (ref 0–200)
HDL: 26 mg/dL — ABNORMAL LOW (ref >40)
LDL Cholesterol: 46 mg/dL (ref 0–99)
Total CHOL/HDL Ratio: 3.7 RATIO
Triglycerides: 123 mg/dL (ref <150)
VLDL: 25 mg/dL (ref 0–40)

## 2019-02-13 LAB — ECHOCARDIOGRAM COMPLETE
Height: 67 in
Weight: 4284.8 oz

## 2019-02-13 LAB — HEMOGLOBIN A1C
Hgb A1c MFr Bld: 5.4 % (ref 4.8–5.6)
Mean Plasma Glucose: 108.28 mg/dL

## 2019-02-13 LAB — HIV ANTIBODY (ROUTINE TESTING W REFLEX): HIV Screen 4th Generation wRfx: NONREACTIVE

## 2019-02-13 LAB — STREP PNEUMONIAE URINARY ANTIGEN: Strep Pneumo Urinary Antigen: NEGATIVE

## 2019-02-13 LAB — GLUCOSE, PLEURAL OR PERITONEAL FLUID: Glucose, Fluid: 120 mg/dL

## 2019-02-13 LAB — ALBUMIN, PLEURAL OR PERITONEAL FLUID: Albumin, Fluid: 1.1 g/dL

## 2019-02-13 LAB — PROTEIN, PLEURAL OR PERITONEAL FLUID: Total protein, fluid: <3 g/dL

## 2019-02-13 MED ORDER — POTASSIUM CHLORIDE CRYS ER 20 MEQ PO TBCR
40.0000 meq | EXTENDED_RELEASE_TABLET | Freq: Two times a day (BID) | ORAL | Status: AC
  Administered 2019-02-13 – 2019-02-14 (×2): 40 meq via ORAL
  Filled 2019-02-13: qty 2

## 2019-02-13 MED ORDER — DILTIAZEM HCL 30 MG PO TABS
30.0000 mg | ORAL_TABLET | Freq: Four times a day (QID) | ORAL | Status: DC
  Administered 2019-02-13 – 2019-02-14 (×3): 30 mg via ORAL
  Filled 2019-02-13 (×3): qty 1

## 2019-02-13 MED ORDER — HYDROXYZINE HCL 10 MG PO TABS
10.0000 mg | ORAL_TABLET | Freq: Three times a day (TID) | ORAL | Status: DC | PRN
  Administered 2019-02-13: 10 mg via ORAL
  Filled 2019-02-13: qty 1

## 2019-02-13 MED ORDER — METOPROLOL SUCCINATE ER 25 MG PO TB24
25.0000 mg | ORAL_TABLET | Freq: Every day | ORAL | Status: DC
  Administered 2019-02-13 – 2019-02-19 (×8): 25 mg via ORAL
  Filled 2019-02-13 (×8): qty 1

## 2019-02-13 NOTE — Progress Notes (Signed)
RN updated patient daughter in law, Chrys Racer this morning prior to shift change.

## 2019-02-13 NOTE — Evaluation (Signed)
Physical Therapy Evaluation Patient Details Name: Sharon ShengRebecca Acosta MRN: [id number] DOB: 1955/09/29 Today's Date: 02/13/2019   History of Present Illness  Mrs.Admire is a 63 yo F w/ PMH of HTN [facility] w/ complaints of progressive leg swelling and shortness of breath.  Found to have A-fib w/RVR, CHF with anasarca and PNA.  Clinical Impression  Patient presents with decreased mobility due to decreased activity tolerance, decreased balance and decreased cardiorespiratory reserve.  She currently ambulates with S to minguard A with HR up to 150's and SpO2 94% on 2L O2.  Feel she can benefit from skilled PT in the acute setting to allow return home with family support (reports can stay with son initially if needed.)      Follow Up Recommendations No PT follow up    Equipment Recommendations  Other (comment)(shower seat)    Recommendations for Other Services       Precautions / Restrictions Precautions Precautions: Other (comment) Precaution Comments: watch HR      Mobility  Bed Mobility Overal bed mobility: Modified Independent                Transfers Overall transfer level: Modified independent                  Ambulation/Gait Ambulation/Gait assistance: Supervision;Min guard Gait Distance (Feet): 90 Feet Assistive device: None Gait Pattern/deviations: Step-to pattern;Step-through pattern;Wide base of support     General Gait Details: mildly unsteady, but mostly SOB with HR into 150's  Stairs            Wheelchair Mobility    Modified Rankin (Stroke Patients Only)       Balance Overall balance assessment: Mild deficits observed, not formally tested                                           Pertinent Vitals/Pain Pain Assessment: No/denies pain    Home Living Family/patient expects to be discharged to:: Private residence Living Arrangements: Alone Available Help at Discharge: Family(son lives in town) Type of Home:  House Home Access: Stairs to enter Entrance Stairs-Rails: None Secretary/administratorntrance Stairs-Number of Steps: 5 Home Layout: One level Home Equipment: Cane - single point      Prior Function Level of Independence: Independent         Comments: can stay with son who lives close if need be, his home does have grabbars in shower (pt was working as Research officer, 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[employer])     [employer] Dominance        Extremity/Trunk Assessment   Upper Extremity Assessment Upper Extremity Assessment: Overall WFL for tasks assessed    Lower Extremity Assessment Lower Extremity Assessment: Overall WFL for tasks assessed(pitting edema in lower legs)       Communication   Communication: No difficulties  Cognition Arousal/Alertness: Awake/alert Behavior During Therapy: WFL for tasks assessed/performed Overall Cognitive Status: Within Functional Limits for tasks assessed                                        General Comments General comments (skin integrity, edema, etc.): SpO2 on 2L O2 throughout ambulation 94%, HR up to 150's    Exercises     Assessment/Plan    PT Assessment Patient needs continued PT services  PT Problem List  Cardiopulmonary status limiting activity;Decreased mobility;Decreased activity tolerance;Decreased balance       PT Treatment Interventions Stair training;Therapeutic activities;Balance training;Patient/family education;Therapeutic exercise;Functional mobility training;Gait training    PT Goals (Current goals can be found in the Care Plan section)  Acute Rehab PT Goals Patient Stated Goal: to return to indpendent PT Goal Formulation: With patient Time For Goal Achievement: 02/20/19 Potential to Achieve Goals: Good    Frequency Min 3X/week   Barriers to discharge        Co-evaluation               AM-PAC PT "6 Clicks" Mobility  Outcome Measure Help needed turning from your back to your side while in a flat bed without using bedrails?:  None Help needed moving from lying on your back to sitting on the side of a flat bed without using bedrails?: None Help needed moving to and from a bed to a chair (including a wheelchair)?: None Help needed standing up from a chair using your arms (e.g., wheelchair or bedside chair)?: None Help needed to walk in hospital room?: A Little Help needed climbing 3-5 steps with a railing? : A Little 6 Click Score: 22    End of Session Equipment Utilized During Treatment: Oxygen Activity Tolerance: Patient limited by fatigue Patient left: in chair;with call bell/phone within reach   PT Visit Diagnosis: Difficulty in walking, not elsewhere classified (R26.2)    Time: 3646-8032 PT Time Calculation (min) (ACUTE ONLY): 28 min   Charges:   PT Evaluation $PT Eval Moderate Complexity: 1 Mod PT Treatments $Gait Training: 8-22 mins        Magda Kiel, [place] [phone] 02/13/2019   Reginia [place] 02/13/2019, 12:37 PM

## 2019-02-13 NOTE — Progress Notes (Signed)
Patient has put out nearly 5021ml of urine since admission at 2300.  Given a total of 80mg  of lasix.  RN notified MD.

## 2019-02-13 NOTE — Progress Notes (Signed)
OT Cancellation Note  Patient Details Name: Sharon Acosta MRN: [id number] DOB: Mar 03, 1956   Cancelled Treatment:    Reason Eval/Treat Not Completed: Patient at procedure or test/ unavailable(Thorencentesis)  Merri Ray Kalayna Noy 02/13/2019, 5:32 PM   Hulda Humphrey OTR/L Acute Rehabilitation Services Pager: [phone] Office: [phone]

## 2019-02-13 NOTE — Progress Notes (Signed)
Subjective: Pt seen at the bedside on rounds this AM. Resting in bed on O2 [place] comfortably. She says she has peed a lot since coming into the hospital because of the lasix. Doesn't feel feverish and her breathing is ok. She is okay with doing a thoracentesis to get the fluid off her lungs. Belly doesn't feel swollen to her.   Objective:  Vital signs in last 24 hours: Vitals:   02/13/19 0034 02/13/19 0220 02/13/19 0458 02/13/19 0804  BP: (!) 131/107 (!) 135/108 (!) 137/94 117/81  Pulse: (!) 116 96 (!) 105 (!) 57  Resp:   20 16  Temp:   98.1 F (36.7 C) 98.2 F (36.8 C)  TempSrc:   Oral Oral  SpO2:   97% 96%  Weight:   121.5 kg   Height:       Physical Exam Constitutional:      Appearance: She is obese.  Cardiovascular:     Rate and Rhythm: Tachycardia present. Rhythm irregular.  Pulmonary:     Breath sounds: Examination of the right-upper field reveals rales. Examination of the left-upper field reveals rales. Examination of the right-middle field reveals rales. Examination of the left-middle field reveals decreased breath sounds. Examination of the right-lower field reveals rales. Examination of the left-lower field reveals decreased breath sounds. Decreased breath sounds and rales present.     Comments: 2l O2 Abdominal:     General: Bowel sounds are normal.     Palpations: Abdomen is soft.  Neurological:     Mental Status: She is alert.  Psychiatric:        Mood and Affect: Mood is anxious.      Assessment/Plan:  Active Problems:   Dyspnea  Sharon Acosta is 63 yo F w/ PMH of HTN admit for anasarca with dyspnea likely due to heart failure exacerbation with community acquired pneumonia. Disease script of progressively worsening edema with dyspnea on exertion with poor health follow up suggests undiagnosed heart failure with inadequate treatment as likely source of her symptoms. Her chest X-ray is concerning for pneumonia as bilateral consolidation / pleural effusion would be  expected and she may also have concurrent community acquired pneumonia. Dry weight is unknown but significantly hypervolemic on exam. She will need inpatient admission for further work-up and IV diuresis.  Dyspnea 2/2 CAP ,Pulmonary Edema, Pulmonary Effusion X-ray w/ LLL lobar consolidation. CTA chest w/ dense LLL consolidation with ground-glass opacities. WBC 16.4 w/ left shift Currently satting >92% on 2L. No oxygen use at home. Started on azithromycin and ceftriaxone in ED. Thoracentesis today, no complications, patient given instructions to notify nurse if she becomes short of breath. - C/w Azithromycin 500mg  daily, ceftriaxone 1mg  IV daily (day 1 of 5) - Keep O2 sat >92 - Furosemide 40 mg twice daily - Follow-up thoracentesis results -Strep Pneumon Antigen   Anasarca 2/2 likely undiagnosed heart failure BNP 1433 on admit. No prior cardic hx but has not followed with PCP in the past. Class 4 NYHA with symptomatic dyspnea at rest. Unclear dry weight. Not on diuretics at home. Creatinine 1.16. - F/u echo results - Start IV Furosemide 40mg  BID as above - Strict I&Os - Daily Weights - Fluid restriction - Keep O2 sat >92 - Replenish K as needed >4 - A1c -UP/C ratio - Trend BMP  A.Fib w/ RVR EKG with irregularly irregular rhythm with HR >130. With concurrent anasarca, she would be high risk for developing A.fib if she also has R. Atrial enlargement. CHAD-VAS2 score of 2  or 3 depending on results of echo. Currently on diltiazem drip. - Monitor bp for hypotension - Will require anti-coagulation initiation prior to discharge -PO diltiazem 30 mg every 6 hours and titrate up.  Nurse will stop IV diltiazem 1 hour after giving p.o.  HTN: 130's to 138 systolic today - Monitor  DVT prophx: Lovenox Diet: Low salt Bowel: Senokot Code: DNR  Dispo: Admit patient to Inpatient with expected length of stay greater than 2 midnights.  Thurmon FairJeff Shakima Nisley, MD PGY1  [phone]

## 2019-02-13 NOTE — Plan of Care (Signed)
  Problem: Education: Goal: Knowledge of General Education information will improve Description: Including pain rating scale, medication(s)/side effects and non-pharmacologic comfort measures Outcome: Progressing   Problem: Health Behavior/Discharge Planning: Goal: Ability to manage health-related needs will improve Outcome: Progressing   Problem: Clinical Measurements: Goal: Will remain free from infection Outcome: Progressing   Problem: Nutrition: Goal: Adequate nutrition will be maintained Outcome: Progressing   Problem: Coping: Goal: Level of anxiety will decrease Outcome: Progressing   Problem: Elimination: Goal: Will not experience complications related to urinary retention Outcome: Progressing   Problem: Pain Managment: Goal: General experience of comfort will improve Outcome: Progressing   Problem: Safety: Goal: Ability to remain free from injury will improve Outcome: Progressing   Problem: Skin Integrity: Goal: Risk for impaired skin integrity will decrease Outcome: Progressing   

## 2019-02-13 NOTE — Progress Notes (Signed)
Pt on max titration for cardizem drip with HR ranging from 98-120s.  RN paged imts who gave order for metoprolol PO to be given tonight.

## 2019-02-13 NOTE — Progress Notes (Signed)
  Echocardiogram 2D Echocardiogram has been performed.  Sharon Acosta 02/13/2019, 9:08 AM

## 2019-02-13 NOTE — Progress Notes (Signed)
Patient is labeled a DNR.  Prior to placing DNR band on patient RN verified with patient.  Patient seemed to be confused about what DNR really means.  RN explained to patient and she wishes to discuss with MD in the morning.  Patient would only state "I don't want to be on life support or have my children need to make decisions for me".  RN notified MD.

## 2019-02-13 NOTE — Procedures (Signed)
Thoracentesis Procedure Note  Pre-operative Diagnosis: Left Pleural Effusion  Indications:  Diagnostic and therapeutic.   Procedure Details:  Informed consent was obtained after explanation of the risks and benefits of the procedure, refer to the consent documentation.  Time-out Performed immediately prior to the procedure.  All available chest radiographs were reviewed and the patient was subsequently placed in a sitting/semi-recumbent position.  Using ultrasound guidance a large pleural effusion was noted on the left.  The area was prepped with chlorhexadine and draped in sterile fashion.  Following this 1% lidocaine was injected subcutaneously and deep to provide anesthesia.  A small incision was then made parallel and superior to the rib, the thoracentesis needle with catheter was inserted into the chest wall and advanced under constant aspiration.  Upon aspiration of pleural fluid, the catheter was advanced into the pleural space and the needle was removed.  The catheter was then connected to a drainange bag and the fluid was removed under manual drainage. The catheter was then removed during slow forced exhalation and a sterile bandage was placed.  Findings:   1400 mL of  Light yellow pleural fluid was removed.  Fluid was sent for Cytology, culture , PH, Protein, LDH, Glucose, Cell Count, and Albumin  Condition: The patient tolerated the procedure well and remains in the same condition as pre-procedure.  Complications: No complications during this procedure.   Plan: Post-procedure ultrasound showed appropriate lung sliding, which makes a pneumothorax unlikely. A routine post-procedure xray is not necessary unless the patient develops new symptoms.   Tamsen Snider, MD PGY1  [phone]

## 2019-02-14 ENCOUNTER — Inpatient Hospital Stay (HOSPITAL_COMMUNITY): Payer: BC Managed Care – PPO

## 2019-02-14 ENCOUNTER — Encounter (HOSPITAL_COMMUNITY): Payer: Self-pay

## 2019-02-14 DIAGNOSIS — R0600 Dyspnea, unspecified: Secondary | ICD-10-CM

## 2019-02-14 DIAGNOSIS — F419 Anxiety disorder, unspecified: Secondary | ICD-10-CM

## 2019-02-14 LAB — COMPREHENSIVE METABOLIC PANEL
ALT: 23 U/L (ref 0–44)
AST: 19 U/L (ref 15–41)
Albumin: 2.4 g/dL — ABNORMAL LOW (ref 3.5–5.0)
Alkaline Phosphatase: 63 U/L (ref 38–126)
Anion gap: 13 (ref 5–15)
BUN: 13 mg/dL (ref 8–23)
CO2: 29 mmol/L (ref 22–32)
Calcium: 8.3 mg/dL — ABNORMAL LOW (ref 8.9–10.3)
Chloride: 101 mmol/L (ref 98–111)
Creatinine, Ser: 0.83 mg/dL (ref 0.44–1.00)
GFR calc Af Amer: >60 mL/min (ref >60)
GFR calc non Af Amer: >60 mL/min (ref >60)
Glucose, Bld: 102 mg/dL — ABNORMAL HIGH (ref 70–99)
Potassium: 4.2 mmol/L (ref 3.5–5.1)
Sodium: 143 mmol/L (ref 135–145)
Total Bilirubin: 1.3 mg/dL — ABNORMAL HIGH (ref 0.3–1.2)
Total Protein: 5.7 g/dL — ABNORMAL LOW (ref 6.5–8.1)

## 2019-02-14 LAB — CBC
HCT: 50.4 % — ABNORMAL HIGH (ref 36.0–46.0)
Hemoglobin: 16.3 g/dL — ABNORMAL HIGH (ref 12.0–15.0)
MCH: 30.6 pg (ref 26.0–34.0)
MCHC: 32.3 g/dL (ref 30.0–36.0)
MCV: 94.7 fL (ref 80.0–100.0)
Platelets: 220 10*3/uL (ref 150–400)
RBC: 5.32 MIL/uL — ABNORMAL HIGH (ref 3.87–5.11)
RDW: 15.6 % — ABNORMAL HIGH (ref 11.5–15.5)
WBC: 17.8 10*3/uL — ABNORMAL HIGH (ref 4.0–10.5)
nRBC: 0 % (ref 0.0–0.2)

## 2019-02-14 LAB — LACTATE DEHYDROGENASE: LDH: 210 U/L — ABNORMAL HIGH (ref 98–192)

## 2019-02-14 LAB — MAGNESIUM: Magnesium: 1.9 mg/dL (ref 1.7–2.4)

## 2019-02-14 MED ORDER — APIXABAN 5 MG PO TABS
5.0000 mg | ORAL_TABLET | Freq: Two times a day (BID) | ORAL | Status: DC
  Administered 2019-02-14 – 2019-02-24 (×20): 5 mg via ORAL
  Filled 2019-02-14 (×20): qty 1

## 2019-02-14 MED ORDER — DILTIAZEM HCL 30 MG PO TABS
30.0000 mg | ORAL_TABLET | ORAL | Status: AC
  Administered 2019-02-14: 30 mg via ORAL
  Filled 2019-02-14: qty 1

## 2019-02-14 MED ORDER — DILTIAZEM HCL 60 MG PO TABS
60.0000 mg | ORAL_TABLET | Freq: Four times a day (QID) | ORAL | Status: DC
  Administered 2019-02-14 – 2019-02-15 (×4): 60 mg via ORAL
  Filled 2019-02-14 (×4): qty 1

## 2019-02-14 NOTE — Evaluation (Signed)
Occupational Therapy Evaluation Patient Details Name: Sharon Acosta MRN: [id number] DOB: 1956/05/21 Today's Date: 02/14/2019    History of Present Illness Sharon Acosta is a 63 yo F w/ PMH of HTN presenting to [facility] w/ complaints of progressive leg swelling and shortness of breath.  Found to have A-fib w/RVR, CHF with anasarca and PNA.   Clinical Impression   Pt admitted with above. She demonstrates the below listed deficits and will benefit from continued OT to maximize safety and independence with BADLs.  Pt presents to OT with generalized weakness, decreased activity tolerance.  She is able to perform ADLs at min guard assist level, but fatigues quickly, requiring frequent rest breaks.  02 sats 91% on 2L supplemental 02.  She reports she lives alone and was independent PTA, but acknowledges she was progressively declining for months, but had a significant decline in function over the past 3 weeks PTA.  She plans to stay with her son and his family at discharge.  She may need a hospital bed as they don't have a bed for her to use on the main level, and if she were to discharge home, she has a waterbed.  Will follow.       Follow Up Recommendations  No OT follow up;Supervision - Intermittent    Equipment Recommendations  Tub/shower seat;Other (comment)(may need a hospital bed - she has a water bed)    Recommendations for Other Services       Precautions / Restrictions Precautions Precautions: Other (comment) Precaution Comments: watch HR      Mobility Bed Mobility Overal bed mobility: Modified Independent                Transfers Overall transfer level: Modified independent                    Balance Overall balance assessment: Mild deficits observed, not formally tested                                         ADL either performed or assessed with clinical judgement   ADL Overall ADL's : Needs assistance/impaired Eating/Feeding: Independent    Grooming: Wash/dry hands;Wash/dry face;Oral care;Brushing hair;Min guard;Standing   Upper Body Bathing: Set up;Sitting   Lower Body Bathing: Min guard;Sit to/from stand   Upper Body Dressing : Set up;Sitting   Lower Body Dressing: Min guard;Sit to/from stand   Toilet Transfer: Min guard;Ambulation;Comfort height toilet;Grab bars;RW   Toileting- Clothing Manipulation and Hygiene: Moderate assistance;Sit to/from stand Toileting - Clothing Manipulation Details (indicate cue type and reason): difficulty accessing peri area due to increased DOE      Functional mobility during ADLs: Min guard General ADL Comments: DOE 3/4 with activity      Vision         Perception     Praxis      Pertinent Vitals/Pain Pain Assessment: No/denies pain     Hand Dominance Right   Extremity/Trunk Assessment Upper Extremity Assessment Upper Extremity Assessment: Generalized weakness   Lower Extremity Assessment Lower Extremity Assessment: Generalized weakness   Cervical / Trunk Assessment Cervical / Trunk Assessment: Normal   Communication Communication Communication: No difficulties   Cognition Arousal/Alertness: Awake/alert Behavior During Therapy: WFL for tasks assessed/performed Overall Cognitive Status: Within Functional Limits for tasks assessed  General Comments  02 sats 91% on 2L supplemental 02     Exercises     Shoulder Instructions      Home Living Family/patient expects to be discharged to:: Private residence Living Arrangements: Alone Available Help at Discharge: Family Type of Home: House Home Access: Stairs to enter Secretary/administratorntrance Stairs-Number of Steps: 5 Entrance Stairs-Rails: None Home Layout: One level     Bathroom Shower/Tub: Chief Strategy OfficerTub/shower unit   Bathroom Toilet: Handicapped height     Home Equipment: Cane - single point   Additional Comments: Pt reports she will likely discharge to her son's home, where  there is a ramp and increased support       Prior Functioning/Environment Level of Independence: Independent        Comments: Pt worked full time as an International aid/development workerassistant manager at [employer], but reports she had been progressively SOB for weeks and decreased activity tolerance for months.  She reports she had progressed to only being able to walk to her office using a shopping cart to lean on, then needed to sit.  She reports she had to take extensive rest breaks between each ADL task to catch her breath and regain enough strength to do the task         OT Problem List: Decreased strength;Decreased activity tolerance;Decreased knowledge of use of DME or AE;Cardiopulmonary status limiting activity;Obesity      OT Treatment/Interventions: Self-care/ADL training;Therapeutic exercise;Energy conservation;DME and/or AE instruction;Therapeutic activities;Patient/family education;Balance training    OT Goals(Current goals can be found in the care plan section) Acute Rehab OT Goals Patient Stated Goal: to go home  OT Goal Formulation: With patient Time For Goal Achievement: 02/28/19 Potential to Achieve Goals: Good ADL Goals Pt Will Perform Upper Body Bathing: with modified independence;standing;sitting Pt Will Perform Lower Body Bathing: with modified independence;sit to/from stand Pt Will Perform Upper Body Dressing: with modified independence;sitting Pt Will Perform Lower Body Dressing: with modified independence;sit to/from stand Pt Will Transfer to Toilet: with modified independence;ambulating;regular height toilet;bedside commode;grab bars Pt Will Perform Toileting - Clothing Manipulation and hygiene: with modified independence;with adaptive equipment;sit to/from stand Additional ADL Goal #1: Will complete ADLs with DOE no greater than 2/4 and 02 sats >90%  OT Frequency: Min 2X/week   Barriers to D/C:            Co-evaluation              AM-PAC OT "6 Clicks" Daily Activity      Outcome Measure Help from another person eating meals?: None Help from another person taking care of personal grooming?: A Little Help from another person toileting, which includes using toliet, bedpan, or urinal?: A Little Help from another person bathing (including washing, rinsing, drying)?: A Little Help from another person to put on and taking off regular upper body clothing?: A Little Help from another person to put on and taking off regular lower body clothing?: A Little 6 Click Score: 19   End of Session Equipment Utilized During Treatment: Oxygen Nurse Communication: Mobility status  Activity Tolerance: Patient limited by fatigue Patient left: in bed;with call bell/phone within reach;with nursing/sitter in room  OT Visit Diagnosis: Unsteadiness on feet (R26.81)                Time: [id number] OT Time Calculation (min): 51 min Charges:  OT General Charges $OT Visit: 1 Visit OT Evaluation $OT Eval Moderate Complexity: 1 Mod OT Treatments $Self Care/Home Management : 8-22 mins $Therapeutic Activity: 8-22 mins  Camala Talwar  Mackinley Cassaday, OTR/L Acute Rehabilitation Services Pager [phone] Office [phone]   Jeani HawkingConarpe, Deshana Rominger M 02/14/2019, 6:30 PM

## 2019-02-14 NOTE — Progress Notes (Addendum)
Subjective:    Patient resting in bed on exam.  Results of testing so far reviewed.  Patient is rather anxious to return to work, but informed she is not ready for discharge. We will continued to giver her lasix in order to remove the extra fluid today. She continues to be in Afib and we are titrating her medication to control the rate. She is told we will start Eliquis today to reduce stroke risk. OT/PT will also evaluate her while she is in the hospital.   Objective:  Vital signs in last 24 hours: Vitals:   02/13/19 1951 02/14/19 0208 02/14/19 0526 02/14/19 0544  BP: (!) 119/93 (!) 134/106 (!) 145/102   Pulse: 65 (!) 104 (!) 139 (!) 127  Resp: 18  (!) 22 20  Temp: 98 F (36.7 C) 98.4 F (36.9 C) 98.1 F (36.7 C)   TempSrc: Oral Oral Oral   SpO2: 95% 96% 98%   Weight:   116.7 kg   Height:       Physical Exam Constitutional:      Appearance: She is obese.  Cardiovascular:     Rate and Rhythm: Tachycardia present. Rhythm irregular.  Pulmonary:     Effort: Pulmonary effort is normal.     Breath sounds: Examination of the left-middle field reveals rales. Examination of the right-lower field reveals rales. Examination of the left-lower field reveals rales. Rales present.     Comments: On 2L [place] Abdominal:     General: Bowel sounds are normal.     Palpations: Abdomen is soft.  Musculoskeletal:     Right lower leg: Edema present.     Left lower leg: Edema present.  Neurological:     Mental Status: She is alert.      Assessment/Plan:  Active Problems:   Dyspnea  Sharon Acosta is a 63 year old female with past medical history of hypertension here with dyspnea and bilateral lower extremity edema likely secondary to new diastolic heart failure.  # Dyspnea 2/2 L. pulmonary effusion  Pneumonia?  Thoracentesis yesterday, pulled off 1400 ml . LDH Serum/fluid .66, no organisms seen on grams stain, and cell count 1,337 with mononuclear cells.  Cytology is pending.  Likely  represents a hydrostatic process due to diastolic heart failure when putting together with the patient's history and physical.  Patient reports a slow progression of fluid build up that has eventually caused shortness of breath.  BNP 1400 on admission, bilateral 2+ pitting edema lower extremities.   7/24 TEE showing EF 50 to 98% , diastolic function could not be evaluated due to atrial fibrillation. Repeat x-ray today showed improvement of the left lung base opacity.  Will continue to trend WBCs.  Patient without other symptoms (cough, fever, chills) .  We will continue antibiotics. -Day 2/5 azithromycin and ceftriaxone -Keep O2 > 92% - Furosemide 40mg  BID - [place] I/O, Fluid Restriction, [employer]  #A. Fib w/ RVR Patient still in A. fib. Rate better controlled today on diltiazem 60 mg every 6 hours.  Right atrium mildly dilated on echo.  We will start on [facility] today. -PO diltiazem 60 mg every 6 hours -Eliquis 5 mg twice daily  #Anxiety Spoke to daughter-in-law today per patient's request.  Daughter-in-law reports patient has been more anxious and depressed since losing her husband a few years back to [place].  This may be part of patient's apparent anxiety in the hospital which she has not shared. -Hydroxyzine 10 mg 3 times daily as needed  VTE prophx:  Eliquis Diet: Low salt Bowel: Senokot Code: DNR   Dispo: Anticipated discharge in approximately 5-7  day(s).   Sharon FairJeff Sharnay Cashion, MD PGY1  [phone]

## 2019-02-15 DIAGNOSIS — I4819 Other persistent atrial fibrillation: Secondary | ICD-10-CM | POA: Diagnosis present

## 2019-02-15 DIAGNOSIS — I4891 Unspecified atrial fibrillation: Secondary | ICD-10-CM | POA: Diagnosis present

## 2019-02-15 DIAGNOSIS — J90 Pleural effusion, not elsewhere classified: Secondary | ICD-10-CM | POA: Diagnosis present

## 2019-02-15 DIAGNOSIS — I4811 Longstanding persistent atrial fibrillation: Secondary | ICD-10-CM | POA: Diagnosis present

## 2019-02-15 DIAGNOSIS — I5032 Chronic diastolic (congestive) heart failure: Secondary | ICD-10-CM | POA: Diagnosis present

## 2019-02-15 DIAGNOSIS — I110 Hypertensive heart disease with heart failure: Secondary | ICD-10-CM

## 2019-02-15 LAB — CBC
HCT: 50.6 % — ABNORMAL HIGH (ref 36.0–46.0)
Hemoglobin: 16.5 g/dL — ABNORMAL HIGH (ref 12.0–15.0)
MCH: 30.8 pg (ref 26.0–34.0)
MCHC: 32.6 g/dL (ref 30.0–36.0)
MCV: 94.4 fL (ref 80.0–100.0)
Platelets: 246 10*3/uL (ref 150–400)
RBC: 5.36 MIL/uL — ABNORMAL HIGH (ref 3.87–5.11)
RDW: 15.4 % (ref 11.5–15.5)
WBC: 16.6 10*3/uL — ABNORMAL HIGH (ref 4.0–10.5)
nRBC: 0 % (ref 0.0–0.2)

## 2019-02-15 LAB — BASIC METABOLIC PANEL
Anion gap: 12 (ref 5–15)
BUN: 13 mg/dL (ref 8–23)
CO2: 31 mmol/L (ref 22–32)
Calcium: 8.2 mg/dL — ABNORMAL LOW (ref 8.9–10.3)
Chloride: 97 mmol/L — ABNORMAL LOW (ref 98–111)
Creatinine, Ser: 0.86 mg/dL (ref 0.44–1.00)
GFR calc Af Amer: >60 mL/min (ref >60)
GFR calc non Af Amer: >60 mL/min (ref >60)
Glucose, Bld: 92 mg/dL (ref 70–99)
Potassium: 3.7 mmol/L (ref 3.5–5.1)
Sodium: 140 mmol/L (ref 135–145)

## 2019-02-15 LAB — MAGNESIUM: Magnesium: 1.8 mg/dL (ref 1.7–2.4)

## 2019-02-15 LAB — PH, BODY FLUID: pH, Body Fluid: 7.6

## 2019-02-15 MED ORDER — POTASSIUM CHLORIDE CRYS ER 20 MEQ PO TBCR
20.0000 meq | EXTENDED_RELEASE_TABLET | Freq: Once | ORAL | Status: AC
  Administered 2019-02-15: 20 meq via ORAL
  Filled 2019-02-15: qty 1

## 2019-02-15 MED ORDER — DILTIAZEM HCL 60 MG PO TABS
90.0000 mg | ORAL_TABLET | Freq: Four times a day (QID) | ORAL | Status: DC
  Administered 2019-02-15 – 2019-02-18 (×11): 90 mg via ORAL
  Filled 2019-02-15 (×11): qty 1

## 2019-02-15 MED ORDER — POTASSIUM CHLORIDE CRYS ER 10 MEQ PO TBCR: 10.0000 meq | EXTENDED_RELEASE_TABLET | Freq: Two times a day (BID) | ORAL | Status: DC

## 2019-02-15 MED ORDER — DILTIAZEM HCL 60 MG PO TABS: 60.0000 mg | ORAL_TABLET | Freq: Four times a day (QID) | ORAL | Status: DC

## 2019-02-15 MED ORDER — MAGNESIUM SULFATE 2 GM/50ML IV SOLN
2.0000 g | Freq: Once | INTRAVENOUS | Status: AC
  Administered 2019-02-15: 2 g via INTRAVENOUS
  Filled 2019-02-15: qty 50

## 2019-02-15 MED ORDER — DILTIAZEM HCL 60 MG PO TABS: 90.0000 mg | ORAL_TABLET | Freq: Four times a day (QID) | ORAL | Status: DC

## 2019-02-15 NOTE — Progress Notes (Signed)
@  5537: CTM called to inform Pt's HR went up to 160-170. Came to assess pt. She was working with OT. Pt denied any SOB or discomfort. Pt's vital signs at this time: BP 125/96, O2 sat: 93. Continue to monitor pt.

## 2019-02-15 NOTE — Progress Notes (Signed)
Occupational Therapy Treatment Patient Details Name: Sharon Acosta MRN: [id number] DOB: 1956-05-11 Today's Date: 02/15/2019    History of present illness Sharon Acosta is a 63 yo F w/ PMH of HTN presenting to [facility] w/ complaints of progressive leg swelling and shortness of breath.  Found to have A-fib w/RVR, CHF with anasarca and PNA.   OT comments  Pt. Seen for skilled OT tx. Session. Focus of session ADL completion with compensatory strategies for HR and o2 management.  Pt. Verbalizing strategies but requires cues for implementation.    HR 160, 02 94% on 2liters- after standing activity and short distance ambulation HR 135 immediate with sitting HR 120 after approx. 3 min. Of sitting BP 125/96-107   Follow Up Recommendations  No OT follow up;Supervision - Intermittent    Equipment Recommendations  Tub/shower seat;Other (comment)    Recommendations for Other Services      Precautions / Restrictions Precautions Precautions: Other (comment) Precaution Comments: watch HR       Mobility Bed Mobility Overal bed mobility: Modified Independent                Transfers Overall transfer level: Modified independent                    Balance                                           ADL either performed or assessed with clinical judgement   ADL Overall ADL's : Needs assistance/impaired     Grooming: Oral care;Standing;Supervision/safety;Cueing for compensatory techniques Grooming Details (indicate cue type and reason): educated on compensatory techniques encouraged seated and/or seated rest break after completion of oral care but pt. declined             Lower Body Dressing: Supervision/safety;Sitting/lateral leans;Cueing for compensatory techniques Lower Body Dressing Details (indicate cue type and reason): educated on turning to the side in the bed and pulling leg up to reach foot for LB dressing then turning in bed and pulling opposite leg  in for LB dressing.  reviewed dangers in bending forward to reach feet.  pt. did verbalize she "pre loaded" socks, underwear, and pants at one time before standing up. Toilet Transfer: Min guard;Ambulation;RW Toilet Transfer Details (indicate cue type and reason): simulated with in room ambulation from eob to sink, then to recliner (denied need for actual use) Toileting- Clothing Manipulation and Hygiene: Min guard;Sitting/lateral lean;Sit to/from stand Toileting - Clothing Manipulation Details (indicate cue type and reason): simulated during in room tasks     Functional mobility during ADLs: Min guard General ADL Comments: verbalized understanding of energy conservation techniques, but was not consistent with implementation. ie: reviewed LB dressing technique and she still bent all the way forward to adjust 2nd sock. declined seated restbreak after standing grooming task and walked to recliner.  HR 160, rn aware. quickly down to 135 once seated. when i reviewed this information with her she said "yeah i can see that, youre probally right, im sorry".  reviewed she did not need to apologize just wanting her to see that modifications will enable her to be safe and do more.     Vision       Perception     Praxis      Cognition Arousal/Alertness: Awake/alert Behavior During Therapy: WFL for tasks assessed/performed Overall Cognitive Status: Within Functional Limits for  tasks assessed                                 General Comments: says "im sorry" "im sorry" alot with any instruction or feedback. reviewed its okay she does not have to apologize we are trying to help. she says "just a habit i guess, i always apologize"        Exercises     Shoulder Instructions       General Comments      Pertinent Vitals/ Pain       Pain Assessment: No/denies pain  Home Living                                          Prior Functioning/Environment               Frequency  Min 2X/week        Progress Toward Goals  OT Goals(current goals can now be found in the care plan section)  Progress towards OT goals: Progressing toward goals     Plan      Co-evaluation                 AM-PAC OT "6 Clicks" Daily Activity     Outcome Measure   Help from another person eating meals?: None Help from another person taking care of personal grooming?: A Little Help from another person toileting, which includes using toliet, bedpan, or urinal?: A Little Help from another person bathing (including washing, rinsing, drying)?: A Little Help from another person to put on and taking off regular upper body clothing?: A Little Help from another person to put on and taking off regular lower body clothing?: A Little 6 Click Score: 19    End of Session Equipment Utilized During Treatment: Oxygen  OT Visit Diagnosis: Unsteadiness on feet (R26.81)   Activity Tolerance Patient limited by fatigue   Patient Left in chair;with call bell/phone within reach   Nurse Communication Other (comment)(nursing aware of spike in HR. vitals taken and pt. was assessed by them and they were in contact with telemetry for updates)        Time: [id number] OT Time Calculation (min): 23 min  Charges: OT General Charges $OT Visit: 1 Visit OT Treatments $Self Care/Home Management : 23-37 mins   Robet LeuMorris, Danaysha Kirn Lorraine, COTA/L 02/15/2019, 10:12 AM

## 2019-02-15 NOTE — Progress Notes (Signed)
   Subjective:   Patient was examined at the bedside this morning. Continues to feel shortness of breath. Mentioned conversation yesterday with patients daughter in law. Discussed chest-xray that demonstrated pneumonia less likely as reason for stopping antibiotic treatment, continuation of atrial fibrillation monitoring, and explanation that remaining fluid overload indicative of diastolic heart failure.   Patient was able to work with occupational therapy in the evening. Advised patient to periodicly transfer to chair from bed to maintain strength.   She did not have any additional questions.    Objective:  Vital signs in last 24 hours: Vitals:   02/14/19 1745 02/14/19 2054 02/15/19 0000 02/15/19 0505  BP: (!) 146/95 (!) 127/98    Pulse: 71 (!) 48 (!) 112   Resp: 18 20    Temp:  98.8 F (37.1 C)  98.4 F (36.9 C)  TempSrc:  Oral  Oral  SpO2:  95%    Weight:    115.8 kg  Height:       Physical Exam Constitutional:      General: She is not in acute distress. Cardiovascular:     Rate and Rhythm: Tachycardia present. Rhythm irregular.  Pulmonary:     Breath sounds: Examination of the right-middle field reveals rales. Examination of the left-middle field reveals rales. Examination of the right-lower field reveals rales. Examination of the left-lower field reveals decreased breath sounds. Decreased breath sounds and rales present.  Musculoskeletal:     Right lower leg: Edema present.     Left lower leg: Edema present.     Comments: Edema present up to hip  Skin:    General: Skin is warm and dry.  Neurological:     Mental Status: She is alert.  Psychiatric:        Mood and Affect: Mood is anxious.    Assessment/Plan:  Active Problems:   Dyspnea  Sharon Acosta is a 63 year old female with past medical history of hypertension here with dyspnea and bilateral lower extremity edema likely secondary to new diastolic heart failure.  Dyspnea 2/2 to left pulmonary effusion  Patient is satting well on 2 L.  Continues to have shortness of breath which should improve with diuresis.  Edema up to patient's hip.  Good output on Lasix every day.  Weight on admission 125.6 kg.  Output 4 L yesterday, approximately 13 L fluid with diuresis.  1400 mL with thoracentesis.  Weight today 115.8.  White blood cell count 17.8 down to 16.6 today.  Stopped antibiotics after 3 days of treatment for pneumonia diagnosed on x-ray, however repeat x-ray not concerning for pneumonia.  Patient without cough, fever, or chills.  -Keep O2 greater than 90%, wean oxygen as tolerated - Furosemide 40 mg twice daily -Strict I's/O, fluid restriction, daily weights -BMP, CBC in a.m.  #A. fib with RVR Patient still in A. Fib with RVR.  Tachycardic on exam .  On Eliquis.  Will titrate up p.o. dose of diltiazem today. -P.o. diltiazem 60 mg every 6 hours -Eliquis 5 mg twice daily  #Anxiety   At this time patient's anxiety seems to be a normal response to being ill and in the hospital. Patient did not express any feelings of anxiety today. We will continue to monitor patient for anxiety. -Hydroxyzine 10 mg 3 times daily as needed  VTE prophx: Eliquis Diet: Low salt Bowel: Senokot Code: DNR  Dispo: Anticipated discharge in approximately 5-7 day(s).   Tamsen Snider, MD PGY1  [phone]

## 2019-02-16 LAB — CBC
HCT: 49 % — ABNORMAL HIGH (ref 36.0–46.0)
Hemoglobin: 15.4 g/dL — ABNORMAL HIGH (ref 12.0–15.0)
MCH: 30 pg (ref 26.0–34.0)
MCHC: 31.4 g/dL (ref 30.0–36.0)
MCV: 95.5 fL (ref 80.0–100.0)
Platelets: 242 10*3/uL (ref 150–400)
RBC: 5.13 MIL/uL — ABNORMAL HIGH (ref 3.87–5.11)
RDW: 15.2 % (ref 11.5–15.5)
WBC: 13.4 10*3/uL — ABNORMAL HIGH (ref 4.0–10.5)
nRBC: 0 % (ref 0.0–0.2)

## 2019-02-16 LAB — BASIC METABOLIC PANEL
Anion gap: 11 (ref 5–15)
BUN: 14 mg/dL (ref 8–23)
CO2: 32 mmol/L (ref 22–32)
Calcium: 8 mg/dL — ABNORMAL LOW (ref 8.9–10.3)
Chloride: 97 mmol/L — ABNORMAL LOW (ref 98–111)
Creatinine, Ser: 0.69 mg/dL (ref 0.44–1.00)
GFR calc Af Amer: >60 mL/min (ref >60)
GFR calc non Af Amer: >60 mL/min (ref >60)
Glucose, Bld: 95 mg/dL (ref 70–99)
Potassium: 3.4 mmol/L — ABNORMAL LOW (ref 3.5–5.1)
Sodium: 140 mmol/L (ref 135–145)

## 2019-02-16 LAB — BODY FLUID CULTURE: Culture: NO GROWTH

## 2019-02-16 LAB — MAGNESIUM: Magnesium: 2 mg/dL (ref 1.7–2.4)

## 2019-02-16 MED ORDER — POTASSIUM CHLORIDE CRYS ER 20 MEQ PO TBCR
30.0000 meq | EXTENDED_RELEASE_TABLET | Freq: Two times a day (BID) | ORAL | Status: DC
  Administered 2019-02-16 (×2): 30 meq via ORAL
  Filled 2019-02-16 (×2): qty 1

## 2019-02-16 NOTE — Progress Notes (Signed)
Physical Therapy Treatment Patient Details Name: Sharon ShengRebecca Acosta MRN: [id number] DOB: 1956-03-29 Today's Date: 02/16/2019    History of Present Illness Sharon Acosta is a 63 yo F w/ PMH of HTN [facility] w/ complaints of progressive leg swelling and shortness of breath.  Found to have A-fib w/RVR, CHF with anasarca and PNA.    PT Comments    Pt pleasant in chair on arrival and end of session with legs elevated. Pt on 2L throughout session with SpO2 90-93% on 2L with HR 109-135. Pt with increased gait and activity tolerance with ability to increase hall ambulation and perform HEP. Pt [facility] and pt reporting being eager to transition to doing things on her own. Socks cutting into legs due to edema and pt educated for monitoring the pressure and edema of her extremities.    Follow Up Recommendations  No PT follow up     Equipment Recommendations  Rolling walker with 5" wheels    Recommendations for Other Services       Precautions / Restrictions Precautions Precautions: Fall Precaution Comments: watch HR and O2 Restrictions Weight Bearing Restrictions: No    Mobility  Bed Mobility               General bed mobility comments: in chair on arrival and end of session  Transfers Overall transfer level: Modified independent                  Ambulation/Gait Ambulation/Gait assistance: Supervision Gait Distance (Feet): 200 Feet Assistive device: Rolling walker (2 wheeled) Gait Pattern/deviations: Step-through pattern;Decreased stride length;Trunk flexed   Gait velocity interpretation: >2.62 ft/sec, indicative of community ambulatory General Gait Details: cues for posture and proximity to RW. Pt with 2 standing rest breaks with HR up to 135 with gait with SpO2 90% on 2L during gait   Stairs             Wheelchair Mobility    Modified Rankin (Stroke Patients Only)       Balance Overall balance assessment: Mild  deficits observed, not formally tested                                          Cognition Arousal/Alertness: Awake/alert Behavior During Therapy: WFL for tasks assessed/performed Overall Cognitive Status: Within Functional Limits for tasks assessed                                        Exercises General Exercises - Lower Extremity Long Arc Quad: AROM;20 reps;Both;Seated Hip Flexion/Marching: AROM;20 reps;Both;Seated    General Comments        Pertinent Vitals/Pain Pain Assessment: No/denies pain    Home Living                      Prior Function            PT Goals (current goals can now be found in the care plan section) Progress towards PT goals: Progressing toward goals    Frequency           PT Plan Current plan remains appropriate    Co-evaluation              AM-PAC PT "6 Clicks" Mobility   Outcome Measure  Help needed turning from your back to your side while in a flat bed without using bedrails?: None Help needed moving from lying on your back to sitting on the side of a flat bed without using bedrails?: None Help needed moving to and from a bed to a chair (including a wheelchair)?: None Help needed standing up from a chair using your arms (e.g., wheelchair or bedside chair)?: None Help needed to walk in hospital room?: A Little Help needed climbing 3-5 steps with a railing? : A Little 6 Click Score: 22    End of Session Equipment Utilized During Treatment: Oxygen Activity Tolerance: Patient tolerated treatment well Patient left: in chair;with call bell/phone within reach Nurse Communication: Mobility status PT Visit Diagnosis: Difficulty in walking, not elsewhere classified (R26.2)     Time: 1000-1023 PT Time Calculation (min) (ACUTE ONLY): 23 min  Charges:  $Gait Training: 8-22 mins $Therapeutic Exercise: 8-22 mins                     [place] Pager: [phone] Office: [place] 02/16/2019, 1:24 PM

## 2019-02-16 NOTE — Progress Notes (Addendum)
Subjective:   Patient was seen this morning on rounds. She was resting comfortable in her bed on 2L of oxygen. She is feeling a little better today. She has questions about her leave of absence and how long she will need to be out of work. We discussed how long it would take for her to get home and informed her that if everything goes as planned she may be able to go home by the end of the week, but we will have to take it day by day.  She asked if she would need to stay with her family for a little while after leaving the hospital. She was informed that it may not be a requirement but would be a god idea to have the extra support for a period of time after leaving the hospital.  Objective:  Vital signs in last 24 hours: Vitals:   02/15/19 1135 02/15/19 1214 02/15/19 2022 02/16/19 0338  BP: (!) 139/109  119/77 (!) 135/92  Pulse:  68 94 (!) 112  Resp:   18 18  Temp:  98.4 F (36.9 C) 98.7 F (37.1 C) 98.4 F (36.9 C)  TempSrc:  Oral Oral Oral  SpO2: 95%  97% 95%  Weight:    113.7 kg  Height:       Physical Exam Constitutional:      General: She is not in acute distress. Cardiovascular:     Rate and Rhythm: Normal rate. Rhythm irregular.  Pulmonary:     Breath sounds: Examination of the right-middle field reveals rales. Examination of the left-middle field reveals rales. Examination of the right-lower field reveals rales. Examination of the left-lower field reveals decreased breath sounds. Decreased breath sounds and rales present.  Musculoskeletal:     Right lower leg: Edema present.     Left lower leg: Edema present.     Comments: Edema present up to hip  Skin:    General: Skin is warm and dry.  Neurological:     Mental Status: She is alert.  Psychiatric:        Mood and Affect: Mood normal.        Behavior: Behavior normal.    Assessment/Plan:  Principal Problem:   Acute on chronic heart failure with preserved ejection fraction (HFpEF) (HCC) Active Problems:  Pleural effusion, left   Atrial fibrillation (HCC)  Sharon Acosta is a 63 year old female with past medical history of hypertension here with dyspnea and bilateral lower extremity edema likely secondary to new diastolic heart failure.  HFpEF Left pulmonary effusion ECHO showed normal LVEF. Diastolic function was unable to be evaluated to to A.Fib. Patient remains comfortable on 2L. Some residual dyspnea and remains edematous on exam, but continues to improve. S/P 1400mL therapeutic and diagnostic thoracentesis.  Output 1.91 L yesterday, approximately 17 L fluid recorded with diuresis.    Weight today 113.7  From 125.6 admission. - Continuous pulse ox - Keep O2 greater than 90%, wean oxygen as tolerated -  Furosemide 40 mg twice daily - Strict I's/O, fluid restriction, daily weights - BMP, CBC, Mg AM labs - Follow up cytology, spoke to lab, in process  #A. fib with RVR Patient still in A. Fib. Rate controlled on Tele, Goal < 110 .  On Eliquis.  -P.o. diltiazem 90 mg every 6 hours -Eliquis 5 mg twice daily  #Anxiety   At this time patient's anxiety seems to be a normal response to being ill and in the hospital. . We will continue to monitor  patient for anxiety. -Hydroxyzine 10 mg 3 times daily as needed  VTE prophx: Eliquis Diet: Low salt Bowel: Senokot Code: DNR  Dispo: Anticipated discharge in approximately 5-7 day(s).   Tamsen Snider, MD PGY1  [phone]

## 2019-02-17 ENCOUNTER — Encounter (HOSPITAL_COMMUNITY): Payer: Self-pay | Admitting: General Practice

## 2019-02-17 LAB — CBC
HCT: 50.3 % — ABNORMAL HIGH (ref 36.0–46.0)
Hemoglobin: 16 g/dL — ABNORMAL HIGH (ref 12.0–15.0)
MCH: 29.7 pg (ref 26.0–34.0)
MCHC: 31.8 g/dL (ref 30.0–36.0)
MCV: 93.5 fL (ref 80.0–100.0)
Platelets: 276 10*3/uL (ref 150–400)
RBC: 5.38 MIL/uL — ABNORMAL HIGH (ref 3.87–5.11)
RDW: 14.9 % (ref 11.5–15.5)
WBC: 12.5 10*3/uL — ABNORMAL HIGH (ref 4.0–10.5)
nRBC: 0 % (ref 0.0–0.2)

## 2019-02-17 LAB — BASIC METABOLIC PANEL
Anion gap: 11 (ref 5–15)
BUN: 13 mg/dL (ref 8–23)
CO2: 31 mmol/L (ref 22–32)
Calcium: 8.2 mg/dL — ABNORMAL LOW (ref 8.9–10.3)
Chloride: 96 mmol/L — ABNORMAL LOW (ref 98–111)
Creatinine, Ser: 0.7 mg/dL (ref 0.44–1.00)
GFR calc Af Amer: >60 mL/min (ref >60)
GFR calc non Af Amer: >60 mL/min (ref >60)
Glucose, Bld: 101 mg/dL — ABNORMAL HIGH (ref 70–99)
Potassium: 3.9 mmol/L (ref 3.5–5.1)
Sodium: 138 mmol/L (ref 135–145)

## 2019-02-17 LAB — CULTURE, BLOOD (ROUTINE X 2)
Culture: NO GROWTH
Culture: NO GROWTH
Special Requests: ADEQUATE

## 2019-02-17 LAB — MAGNESIUM: Magnesium: 1.9 mg/dL (ref 1.7–2.4)

## 2019-02-17 MED ORDER — POTASSIUM CHLORIDE CRYS ER 10 MEQ PO TBCR
10.0000 meq | EXTENDED_RELEASE_TABLET | Freq: Two times a day (BID) | ORAL | Status: DC
  Administered 2019-02-17 – 2019-02-19 (×5): 10 meq via ORAL
  Filled 2019-02-17 (×5): qty 1

## 2019-02-17 MED ORDER — MAGNESIUM SULFATE IN D5W 1-5 GM/100ML-% IV SOLN
1.0000 g | Freq: Once | INTRAVENOUS | Status: AC
  Administered 2019-02-17: 1 g via INTRAVENOUS
  Filled 2019-02-17: qty 100

## 2019-02-17 NOTE — Plan of Care (Signed)
  Problem: Clinical Measurements: Goal: Will remain free from infection Outcome: Progressing   Problem: Activity: Goal: Risk for activity intolerance will decrease Outcome: Progressing   Problem: Safety: Goal: Ability to remain free from injury will improve Outcome: Progressing   

## 2019-02-17 NOTE — Plan of Care (Signed)
  Problem: Coping: Goal: Level of anxiety will decrease Outcome: Progressing   Problem: Elimination: Goal: Will not experience complications related to urinary retention Outcome: Progressing   Problem: Safety: Goal: Ability to remain free from injury will improve Outcome: Progressing   

## 2019-02-17 NOTE — Progress Notes (Signed)
Subjective: Patient lying in bed on exam.  Reports feeling about the same, wearing nasal cannula on 2 L.  Patient desaturates to 86% at rest with no O2. No new concerns today. The plan is to continue to diurese today and her A.fib is rate controlled per telemetry review.   Reports she will speak to her son and daughter-in-law tonight and let me know equipment she will need going home. She is not ready for discharge, but equipment has been recommended by PT/OT. It is likely she will not need a hospital bed, per patient she will have another option than the water bed. Appreciate Case Managers help with this.    Objective:  Vital signs in last 24 hours: Vitals:   02/17/19 0804 02/17/19 0806 02/17/19 0826 02/17/19 1112  BP: (!) 128/110 (!) 128/110  126/87  Pulse:  (!) 103 98 (!) 52  Resp:    18  Temp:    98.2 F (36.8 C)  TempSrc:    Oral  SpO2:  93%  95%  Weight:      Height:       Physical Exam Constitutional:      General: She is not in acute distress. Cardiovascular:     Rate and Rhythm: Normal rate. Rhythm irregular.  Pulmonary:     Breath sounds: Examination of the right-middle field reveals rales. Examination of the left-middle field reveals rales. Examination of the right-lower field reveals rales. Examination of the left-lower field reveals decreased breath sounds. Decreased breath sounds and rales present.  Musculoskeletal:     Right lower leg: She exhibits no tenderness. Edema present.     Left lower leg: She exhibits no tenderness. Edema present.  Skin:    General: Skin is warm.     Findings: No rash.  Neurological:     Mental Status: She is alert.  Psychiatric:        Mood and Affect: Mood normal.    Assessment/Plan:  Principal Problem:   Acute on chronic heart failure with preserved ejection fraction (HFpEF) (HCC) Active Problems:   Pleural effusion, left   Atrial fibrillation (HCC)  Sharon Acosta is a 63 year old female with past medical history of  hypertension here with dyspnea and bilateral lower extremity edema likely secondary to new diastolic heart failure.  HFpEF Left pulmonary effusion ECHO showed normal LVEF. Diastolic function was unable to be evaluated to to A.Fib. Patient remains on 2L. Some residual dyspnea and remains edematous on exam, but continues to improve. S/P 1400mL therapeutic and diagnostic thoracentesis.  No malignant cells identified on cytolgoy of pleural fluid from throacentesis. Output 1.1 L yesterday, approximately Net -16.7 L  recorded with diuresis. Weight today 112.3  from 125.6 admission. - Continuous pulse ox - Keep O2 greater than 90%, wean oxygen as tolerated -  Furosemide 40 mg twice daily - Strict I's/O, fluid restriction, daily weights - BMP, CBC, Mg AM labs - Follow up cytology, spoke to lab, in process  #A. fib with RVR Patient still in A. Fib. Rate controlled on Tele, Goal < 110 .  On Eliquis.  -P.o. diltiazem 90 mg every 6 hours -Eliquis 5 mg twice daily  #Anxiety   At this time patient's anxiety seems to be a normal response to being ill and in the hospital. . We will continue to monitor patient for anxiety. -Hydroxyzine 10 mg 3 times daily as needed  VTE prophx: Eliquis Diet: Low salt Bowel: Senokot Code: DNR  Dispo: Anticipated discharge in approximately 5-7 day(s).  Tamsen Snider, MD PGY1  [phone]

## 2019-02-17 NOTE — TOC Initial Note (Addendum)
Transition of Care [facility]) - Initial/Assessment Note    Patient Details  Name: Sharon Acosta MRN: [id number] Date of Birth: 11-25-1955  Transition of Care [facility]) CM/SW Contact:    Marilu Favre, RN Phone Number: 02/17/2019, 10:58 AM  Clinical Narrative:                 Spoke to patient via phone. Confirmed face sheet information.   Discussed PT/OT  recommendations regarding walker and 3 in 1 ( explained to patient what 3 in 1 consists of ). Patient voiced understanding and does need both for home. Ordered.  OT had recommendation hospital bed due to the fact patient has a water bed at home and she may stay with her son at discharge for a few days prior to returning to her home. Patient is unsure. Patient would like to discuss with her son before ordering hospital bed. Latest OT/PT notes do not recommend hospital bed, would need recommendation from OT ot PT and MD order and progress note stating need.   Will continue to follow for hospital bed need and oxygen need.   Expected Discharge Plan: Home/Self Care Barriers to Discharge: Continued Medical Work up   Patient Goals and CMS Choice Patient states their goals for this hospitalization and ongoing recovery are:: to go home CMS Medicare.gov Compare Post Acute Care list provided to:: Patient Choice offered to / list presented to : NA  Expected Discharge Plan and Services Expected Discharge Plan: Home/Self Care   Discharge Planning Services: CM Consult Post Acute Care Choice: Durable Medical Equipment Living arrangements for the past 2 months: Single Family Home                 DME Arranged: 3-N-1, Walker rolling DME Agency: [employer] Date DME Agency Contacted: 02/17/19 Time DME Agency Contacted: 42 Representative spoke with at DME Agency: [place]: NA          Prior Living Arrangements/Services Living arrangements for the past 2 months: [place] with:: Self Patient language and need for  interpreter reviewed:: Yes Do you feel safe going back to the place where you live?: Yes            Criminal Activity/Legal Involvement Pertinent to Current Situation/Hospitalization: No - Comment as needed  Activities of Daily Living Home Assistive Devices/Equipment: None ADL Screening (condition at time of admission) Patient's cognitive ability adequate to safely complete daily activities?: Yes Is the patient deaf or have difficulty hearing?: No Does the patient have difficulty seeing, even when wearing glasses/contacts?: No Does the patient have difficulty concentrating, remembering, or making decisions?: No Patient able to express need for assistance with ADLs?: Yes Does the patient have difficulty dressing or bathing?: No Independently performs ADLs?: Yes (appropriate for developmental age) Does the patient have difficulty walking or climbing stairs?: Yes Weakness of Legs: None Weakness of Arms/Hands: None  Permission Sought/Granted   Permission granted to share information with : Yes, Verbal Permission Granted     Permission granted to share info w AGENCY: [employer]        Emotional Assessment   Attitude/Demeanor/Rapport: Engaged Affect (typically observed): Accepting Orientation: : Oriented to Self, Oriented to Place, Oriented to  Time, Oriented to Situation Alcohol / Substance Use: Not Applicable Psych Involvement: No (comment)  Admission diagnosis:  new onset afib rvr Patient Active Problem List   Diagnosis Date Noted  . Acute on chronic heart failure with preserved ejection fraction (HFpEF) ([place]) 02/15/2019  . Pleural effusion,  left 02/15/2019  . Atrial fibrillation (HCC) 02/15/2019   PCP:  Practice, [facility]:   [facility] [address] - [place], [place] - [phone] [place] [id number] Phone: [phone] Fax: [phone]     Social Determinants of Health (SDOH) Interventions    Readmission  Risk Interventions No flowsheet data found.

## 2019-02-18 LAB — CBC
HCT: 50.9 % — ABNORMAL HIGH (ref 36.0–46.0)
Hemoglobin: 16.4 g/dL — ABNORMAL HIGH (ref 12.0–15.0)
MCH: 30.1 pg (ref 26.0–34.0)
MCHC: 32.2 g/dL (ref 30.0–36.0)
MCV: 93.6 fL (ref 80.0–100.0)
Platelets: 302 10*3/uL (ref 150–400)
RBC: 5.44 MIL/uL — ABNORMAL HIGH (ref 3.87–5.11)
RDW: 14.8 % (ref 11.5–15.5)
WBC: 11.6 10*3/uL — ABNORMAL HIGH (ref 4.0–10.5)
nRBC: 0 % (ref 0.0–0.2)

## 2019-02-18 LAB — BASIC METABOLIC PANEL
Anion gap: 11 (ref 5–15)
BUN: 13 mg/dL (ref 8–23)
CO2: 31 mmol/L (ref 22–32)
Calcium: 8 mg/dL — ABNORMAL LOW (ref 8.9–10.3)
Chloride: 94 mmol/L — ABNORMAL LOW (ref 98–111)
Creatinine, Ser: 0.8 mg/dL (ref 0.44–1.00)
GFR calc Af Amer: >60 mL/min (ref >60)
GFR calc non Af Amer: >60 mL/min (ref >60)
Glucose, Bld: 92 mg/dL (ref 70–99)
Potassium: 3.7 mmol/L (ref 3.5–5.1)
Sodium: 136 mmol/L (ref 135–145)

## 2019-02-18 LAB — MAGNESIUM: Magnesium: 2.2 mg/dL (ref 1.7–2.4)

## 2019-02-18 MED ORDER — DILTIAZEM HCL ER COATED BEADS 180 MG PO CP24
360.0000 mg | ORAL_CAPSULE | Freq: Every day | ORAL | Status: DC
  Administered 2019-02-18 – 2019-02-24 (×7): 360 mg via ORAL
  Filled 2019-02-18 (×7): qty 2

## 2019-02-18 MED ORDER — DILTIAZEM HCL 60 MG PO TABS: 90.0000 mg | ORAL_TABLET | Freq: Four times a day (QID) | ORAL | Status: DC

## 2019-02-18 NOTE — Progress Notes (Signed)
Physical Therapy Treatment Patient Details Name: Sharon Acosta MRN: [id number] DOB: 01/25/1956 Today's Date: 02/18/2019    History of Present Illness Sharon Acosta is a 63 yo F w/ PMH of HTN [facility] w/ complaints of progressive leg swelling and shortness of breath.  Found to have A-fib w/RVR, CHF with anasarca and PNA.    PT Comments    Patient received up in recliner, states she is getting ready to move rooms because she has ants. Agrees to PT session. Patient demonstrates improved activity tolerance, maintaining O2 sats >95% on 2 lpm O2 while walking. Ambulated 300 feet with RW and supervision. Patient also performed seated LE exercises. She will benefit from continued PT acutely to prepare for discharge home with son.      Follow Up Recommendations  No PT follow up     Equipment Recommendations  Rolling walker with 5" wheels    Recommendations for Other Services       Precautions / Restrictions Precautions Precautions: Fall Precaution Comments: watch HR and O2 Restrictions Weight Bearing Restrictions: No    Mobility  Bed Mobility               General bed mobility comments: not assessed, patient up in recliner.  Transfers Overall transfer level: Modified independent Equipment used: Rolling walker (2 wheeled)                Ambulation/Gait Ambulation/Gait assistance: Modified independent (Device/Increase time) Gait Distance (Feet): 300 Feet Assistive device: Rolling walker (2 wheeled) Gait Pattern/deviations: Step-through pattern;Decreased stride length;Trunk flexed Gait velocity: decreased   General Gait Details: patient's O2 sats remained at 96% throughout and HR was in normal levels.   Stairs             Wheelchair Mobility    Modified Rankin (Stroke Patients Only)       Balance Overall balance assessment: Modified Independent                                          Cognition Arousal/Alertness:  Awake/alert Behavior During Therapy: WFL for tasks assessed/performed Overall Cognitive Status: Within Functional Limits for tasks assessed                                        Exercises Other Exercises Other Exercises: seated LE exercises : toe taps, heel raises, LAQ, marching x 10 reps each bilaterally    General Comments        Pertinent Vitals/Pain Pain Assessment: No/denies pain    Home Living Family/patient expects to be discharged to:: Private residence Living Arrangements: Children Available Help at Discharge: Family Type of Home: House Home Access: Stairs to enter Entrance Stairs-Rails: None Home Layout: One level Home Equipment: [employer] MorCane - single point Additional Comments: Patient is going to her son's home initially, there is ramp, and home is set up for disabilities.    Prior Function Level of Independence: Independent      Comments: Pt worked full time as an International aid/development workerassistant manager at [employer], but reports she had been progressively SOB for weeks and decreased activity tolerance for months.  She reports she had progressed to only being able to walk to her office using a shopping cart to lean on, then needed to sit.  She reports she had to take extensive  rest breaks between each ADL task to catch her breath and regain enough strength to do the task    PT Goals (current goals can now be found in the care plan section) Acute Rehab PT Goals Patient Stated Goal: to go home with son PT Goal Formulation: With patient Time For Goal Achievement: 02/20/19 Potential to Achieve Goals: Good Progress towards PT goals: Progressing toward goals    Frequency    Min 3X/week      PT Plan Current plan remains appropriate    Co-evaluation              AM-PAC PT "6 Clicks" Mobility   Outcome Measure  Help needed turning from your back to your side while in a flat bed without using bedrails?: None Help needed moving from lying on your back to sitting on the  side of a flat bed without using bedrails?: None Help needed moving to and from a bed to a chair (including a wheelchair)?: None Help needed standing up from a chair using your arms (e.g., wheelchair or bedside chair)?: None Help needed to walk in hospital room?: A Little Help needed climbing 3-5 steps with a railing? : A Little 6 Click Score: 22    End of Session Equipment Utilized During Treatment: Gait belt;Oxygen Activity Tolerance: Patient tolerated treatment well Patient left: in bed;with bed alarm set;with call bell/phone within reach Nurse Communication: Mobility status PT Visit Diagnosis: Muscle weakness (generalized) (M62.81);Difficulty in walking, not elsewhere classified (R26.2)     Time: 4536-4680 PT Time Calculation (min) (ACUTE ONLY): 23 min  Charges:  $Gait Training: 8-22 mins $Therapeutic Exercise: 8-22 mins                     [employer], PT, [employer] 02/18/19,10:22 AM

## 2019-02-18 NOTE — Progress Notes (Signed)
   Subjective:   Patient seen on rounds this morning. She states she did not urinate as much yesterday, but was reassured that according to our records she still had good output. She states she is starting to feel her breathing really improving. She plan to get into the chair more today. She was updated o the plan for today. She has no further questions or concerns this morning.  Objective:  Vital signs in last 24 hours: Vitals:   02/17/19 2103 02/18/19 0425 02/18/19 0450 02/18/19 0507  BP: 113/82  (!) 148/123   Pulse: 60 (!) 102 (!) 54   Resp: 20  20   Temp: 98.6 F (37 C)  98.4 F (36.9 C)   TempSrc: Oral  Oral   SpO2: 95%  95%   Weight:    111.1 kg  Height:       Physical Exam Constitutional:      General: She is not in acute distress. Cardiovascular:     Rate and Rhythm: Normal rate. Rhythm irregular.  Pulmonary:     Breath sounds: Examination of the right-middle field reveals rales. Examination of the left-middle field reveals rales. Examination of the right-lower field reveals decreased breath sounds. Examination of the left-lower field reveals decreased breath sounds. Decreased breath sounds and rales present.  Musculoskeletal:     Right lower leg: She exhibits no tenderness. Edema present.     Left lower leg: She exhibits no tenderness. Edema present.  Skin:    General: Skin is warm.     Findings: No rash.  Neurological:     Mental Status: She is alert.  Psychiatric:        Mood and Affect: Mood normal.    Assessment/Plan:  Principal Problem:   Acute on chronic heart failure with preserved ejection fraction (HFpEF) (HCC) Active Problems:   Pleural effusion, left   Atrial fibrillation (HCC)  alysiana ethridge is a 63 year old female with past medical history of hypertension here with dyspnea and bilateral lower extremity edema likely secondary to new diastolic heart failure.  HFpEF Left pulmonary effusion ECHO showed normal LVEF. Diastolic function was unable  to be evaluated due to A-Fib. Patient remains on 2L. Continue to have dyspnea and remains edematous, but both are improving. S/P 1422mL therapeutic and diagnostic thoracentesis.  No malignant cells identified on cytolgoy of pleural fluid from throacentesis. Output ~1.5 L yesterday, approximately Net -18.2 L  recorded with diuresis. Weight today 111.1  from 125.6 admission. - Continuous pulse ox, keep O2 greater than 90%, wean oxygen as tolerated - Furosemide 40mg  IV, twice daily - Strict I's/O, fluid restriction, daily weights - BMP, CBC, Mg AM labs  #A. fib with RVR Patient still in A. Fib. Rate controlled on Tele, Goal < 110 .  On Eliquis.  - Convert Diltiazem to 360mg  ER Daily - Eliquis 5 mg twice daily  #Anxiety At this time patient's anxiety seems to be a normal response to being ill and in the hospital.. We will continue to monitor patient for anxiety. -Hydroxyzine 10 mg 3 times daily as needed  VTE prophx: Eliquis Diet: Low salt Bowel: Senokot Code: DNR  Dispo: Anticipated discharge in approximately 2-4 day(s).   Tamsen Snider, MD PGY1  [phone]

## 2019-02-18 NOTE — Plan of Care (Signed)
  Problem: Activity: Goal: Risk for activity intolerance will decrease Outcome: Progressing  Pt ambulating in room - OOB to chair and BSC.

## 2019-02-18 NOTE — Progress Notes (Signed)
Upon morning rounds, discussed fall risk and provided education to patient regarding fall risk.  Pt refused bed alarm at that time.  Encouraged patient to call for assist prior to getting OOB.  Will continue to monitor.

## 2019-02-18 NOTE — Progress Notes (Signed)
Pt. BP 148/123. On call for IMTS paged to make aware. Pt. Alert and stable. RN will monitor.

## 2019-02-18 NOTE — Progress Notes (Signed)
Assisted patient OOB to [facility] and to chair a few moments ago.  Pt agreeable to chair alarm at this time.  Pt placed on chair alarm and alarm turned on.  Patient encouraged to call prior to getting up for assistance.  Will continue to monitor.

## 2019-02-19 LAB — BASIC METABOLIC PANEL
Anion gap: 11 (ref 5–15)
BUN: 13 mg/dL (ref 8–23)
CO2: 32 mmol/L (ref 22–32)
Calcium: 8.2 mg/dL — ABNORMAL LOW (ref 8.9–10.3)
Chloride: 95 mmol/L — ABNORMAL LOW (ref 98–111)
Creatinine, Ser: 0.99 mg/dL (ref 0.44–1.00)
GFR calc Af Amer: >60 mL/min (ref >60)
GFR calc non Af Amer: >60 mL/min (ref >60)
Glucose, Bld: 102 mg/dL — ABNORMAL HIGH (ref 70–99)
Potassium: 4 mmol/L (ref 3.5–5.1)
Sodium: 138 mmol/L (ref 135–145)

## 2019-02-19 LAB — CBC
HCT: 54.2 % — ABNORMAL HIGH (ref 36.0–46.0)
Hemoglobin: 17.1 g/dL — ABNORMAL HIGH (ref 12.0–15.0)
MCH: 29.9 pg (ref 26.0–34.0)
MCHC: 31.5 g/dL (ref 30.0–36.0)
MCV: 94.9 fL (ref 80.0–100.0)
Platelets: 364 10*3/uL (ref 150–400)
RBC: 5.71 MIL/uL — ABNORMAL HIGH (ref 3.87–5.11)
RDW: 14.8 % (ref 11.5–15.5)
WBC: 10.3 10*3/uL (ref 4.0–10.5)
nRBC: 0 % (ref 0.0–0.2)

## 2019-02-19 MED ORDER — METOPROLOL SUCCINATE ER 25 MG PO TB24
25.0000 mg | ORAL_TABLET | Freq: Three times a day (TID) | ORAL | Status: DC
  Administered 2019-02-19: 25 mg via ORAL
  Filled 2019-02-19: qty 1

## 2019-02-19 MED ORDER — METOPROLOL SUCCINATE ER 50 MG PO TB24: 50.0000 mg | ORAL_TABLET | Freq: Three times a day (TID) | ORAL | Status: DC

## 2019-02-19 MED ORDER — METOPROLOL TARTRATE 25 MG PO TABS
25.0000 mg | ORAL_TABLET | Freq: Three times a day (TID) | ORAL | Status: DC
  Administered 2019-02-20 – 2019-02-24 (×13): 25 mg via ORAL
  Filled 2019-02-19 (×14): qty 1

## 2019-02-19 MED ORDER — METOPROLOL TARTRATE 25 MG PO TABS
25.0000 mg | ORAL_TABLET | Freq: Once | ORAL | Status: AC
  Administered 2019-02-19: 25 mg via ORAL
  Filled 2019-02-19: qty 1

## 2019-02-19 NOTE — Discharge Summary (Signed)
Name: Sharon Acosta MRN: [id number] DOB: Oct 07, 1955 63 y.o. PCP: [facility]  Date of Admission: 02/12/2019  3:34 PM Date of Discharge:  Attending Physician: Sid Falcon, MD  Discharge Diagnosis: 1. HFpEF 2. L. Pulmonary Effusion 3. Atrial Fibrillation   Discharge Medications: Allergies as of 02/24/2019   No Known Allergies     Medication List    STOP taking these medications   ASHWAGANDHA PO     TAKE these medications   albuterol (2.5 MG/3ML) 0.083% nebulizer solution Commonly known as: PROVENTIL Take 3 mLs (2.5 mg total) by nebulization every 6 (six) hours as needed for wheezing or shortness of breath.   apixaban 5 MG Tabs tablet Commonly known as: ELIQUIS Take 1 tablet (5 mg total) by mouth 2 (two) times daily.   diltiazem 360 MG 24 hr capsule Commonly known as: CARDIZEM CD Take 1 capsule (360 mg total) by mouth daily.   furosemide 40 MG tablet Commonly known as: LASIX Take 1 tablet (40 mg total) by mouth 2 (two) times daily.   hydrOXYzine 10 MG tablet Commonly known as: ATARAX/VISTARIL Take 1 tablet (10 mg total) by mouth 3 (three) times daily as needed for anxiety.   Magnesium 250 MG Tabs Take 250 mg by mouth daily with breakfast.   metoprolol tartrate 25 MG tablet Commonly known as: LOPRESSOR Take 1 tablet (25 mg total) by mouth 3 (three) times daily.   naproxen sodium 220 MG tablet Commonly known as: ALEVE Take 220-440 mg by mouth 2 (two) times daily as needed (for headaches or pain).   Potassium 99 MG Tabs Take 99 mg by mouth daily with breakfast.   pyridOXINE 100 MG tablet Commonly known as: VITAMIN B-6 Take 100 mg by mouth daily with breakfast.   senna-docusate 8.6-50 MG tablet Commonly known as: Senokot-S Take 1 tablet by mouth at bedtime as needed for mild constipation.            Durable Medical Equipment  (From admission, onward)         Start     Ordered   02/24/19 0000  For home use only DME Walker  rolling    Question:  Patient needs a walker to treat with the following condition  Answer:  Gait instability   02/24/19 1057   02/24/19 0000  For home use only DME Hospital bed    Question Answer Comment  Length of Need Lifetime   Bed type Semi-electric      02/24/19 1057   02/24/19 0000  For home use only DME Other see comment    Comments: Tub/shower seat  Question:  Length of Need  Answer:  Lifetime   02/24/19 1057   02/23/19 1352  For home use only DME 3 n 1  Once     02/23/19 1351   02/23/19 1352  For home use only DME Walker rolling  Once    Question:  Patient needs a walker to treat with the following condition  Answer:  Pleural effusion associated with pulmonary infection   02/23/19 1351   02/23/19 1350  For home use only DME Hospital bed  Once    Question Answer Comment  Length of Need 12 Months   Patient has (list medical condition): shortness of breath , Community aquired pneumonia , pulmonary edema, pulmonary effusion   The above medical condition requires: Patient requires the ability to reposition frequently   Head must be elevated greater than: 45 degrees   Bed type Semi-electric  Support Surface: Gel Overlay      02/23/19 1349   02/17/19 1111  For home use only DME 3 n 1  Once     02/17/19 1110   02/17/19 1110  For home use only DME Walker rolling  Once    Question:  Patient needs a walker to treat with the following condition  Answer:  Pleural effusion associated with pulmonary infection   02/17/19 1110          Disposition and follow-up:   [name].  At the hospital follow up visit please address:  1.   HTN: BP 121/91 on discharge.   HFpEF Patient discharged with Furosemide 40mg  bid with improved volume status. On discharge, patient weight was 105kg. Likely dry weight. Reassess weight and volume status at visit.  - check BMP  Afib - Continue Eliquis 5mg  twice a day - Follow  up with cardiology for cardioversion in one month.    2.  Labs / imaging needed at time of follow-up: BMP  3.  Pending labs/ test needing follow-up: none  Follow-up Appointments: Follow-up Information    Patwardhan, Anabel BeneManish J, MD Follow up in 3 week(s).   Specialties: Cardiology, Radiology Why: Please follow up with your cardiologist on Thursday August 27th at [facility] Contact information: [address] [place] [address] [phone]        [facility] Follow up in 1 week(s).   Specialty: Family Medicine Contact information: [address] [place] [address] [phone]           Hospital Course by problem list: 1. HFpEF     L. Pleural Effusion Patient presented with progressive swelling over months,most noticeable in LE and DOE.  She has not had routine healthcare and only recently established with PCP for treatment of HTN. BNP 1400, imaging significant for L.pleural effusion and congestion. Thoracentesis on 02/13/2019 with 1.4L of fluid taken off. LDH Serum/fluid .66, no organisms seen on grams stain, and cell count 1,337 with mononuclear cells.  Cytology negative.  Likely represents a hydrostatic process due to diastolic heart failure given patient's HPI and physical exam of BLE edema, JVD and diminished bibasilar breath sounds.  TEE on 7/24 with EF 50-55% showing EF 50- 55% , diastolic function could not be evaluated due to atrial fibrillation. Repeat CXR on 7/25 showed improvement of the left lung base opacity. Good output on 40 mg IV Lasix BID which was transitioned to 40mg  PO Lasix bid and still had good output.Patient came in at 125 kg and her dry weight on discharge is 105kg. On examination, patient's JVD, BLE edema and bibasilar lung sounds improved to minimal BLE edema with compression stockings. Bibasilar lung sounds with minimal crackles.   2.  Atrial Fibrillation Patient with new onset A.Fib with RVR. Initially on a  diltiazem drip. Titrated up on PO Diltiazem and also started on Metoprolol. She is on Diltiazem 360 mg and Metoprolol 25mg  TID. Started on Apixiban on 7/25. Patient remained in A.fib during hospital stay but no episodes of RVR. Rate controlled with diltiazem and metoprolol. Cardiology recommends follow up as outpatient after one month of anticoagulation for cardioversion.   Discharge Vitals:   BP (!) 147/97   Pulse (!) 102   Temp 98 F (36.7 C) (Oral)   Resp 20   Ht 5\' 7"  (1.702 m)   Wt 105 kg Comment: scale b  SpO2 95%  BMI 36.24 kg/m   Pertinent Labs, Studies, and Procedures:   7/23 Chest Xray  FINDINGS: Cardiac silhouette partly obscured, likely mildly enlarged. No mediastinal masses.  Moderate to large left pleural effusion opacifying the left hemithorax to the level of the left hilum. No right pleural effusion.  Lungs show prominent bronchovascular markings. There is opacity adjacent to the left pleural effusion consistent with atelectasis. No convincing pneumonia and no overt pulmonary edema.  No pneumothorax.  Skeletal structures are grossly intact.  IMPRESSION: 1. Moderate to large left pleural effusion with associated atelectasis. 2. No convincing pneumonia or pulmonary edema.  7/23 CT Angio Chest  FINDINGS: Cardiovascular: There is no definite evidence of pulmonary embolus. Evaluation for pulmonary embolus at the left lower lobe is somewhat suboptimal due to surrounding airspace opacification.  The heart is borderline prominent. Mild calcification is noted at the aortic arch.  Mediastinum/Nodes: No definite mediastinal lymphadenopathy is seen. No pericardial effusion is identified. The visualized portions of the thyroid gland are unremarkable. No axillary lymphadenopathy is seen.  Lungs/Pleura: There is a small to moderate left-sided pleural effusion. There is dense consolidation of the left lower lobe, and scattered ground-glass airspace  opacity within the left lung. This may reflect asymmetric pulmonary edema or possibly pneumonia. The left lung appears clear. No pneumothorax is seen.  Upper Abdomen: The visualized portions of the liver are unremarkable. The spleen appears diminutive.  Musculoskeletal: No acute osseous abnormalities are identified. The visualized musculature is unremarkable in appearance. Soft tissue edema is noted along the anterior chest wall, left breast and mid back.  Review of the MIP images confirms the above findings.  IMPRESSION: 1. No definite evidence of pulmonary embolus. 2. Dense consolidation of the left lower lobe, scattered ground-glass airspace opacity at the left lung, and small to moderate left-sided pleural effusion. This may reflect asymmetric pulmonary edema or possibly pneumonia. 3. Soft tissue edema along the anterior chest wall, left breast and mid back.  7/25 Chest xray FINDINGS: Cardiomediastinal silhouette unchanged in size and contour.  Interlobular septal thickening bilateral lungs is unchanged. Opacity at the left lung base has decreased in size with improved aeration and no pneumothorax. Obscuration left hemidiaphragm and the left heart border persist.  No new right-sided airspace disease.  IMPRESSION: Improved left-sided pleural effusion with no pneumothorax.  7/24 TTE  IMPRESSIONS    1. The left ventricle has low normal systolic function, with an ejection fraction of 50-55%. The cavity size was normal. There is mildly increased left ventricular wall thickness. Left ventricular diastolic function could not be evaluated secondary to  atrial fibrillation. Indeterminate filling pressures No evidence of left ventricular regional wall motion abnormalities.  2. The right ventricle has normal systolic function. The cavity was normal. There is no increase in right ventricular wall thickness. Right ventricular systolic pressure is moderately elevated at  46mmHg.  3. Right atrial size was moderately dilated.  4. There is mild to moderate mitral annular calcification present.  5. The aorta is normal in size and structure.  6. Large pleural effusion in the left lateral region.  7. The inferior vena cava was dilated in size with <50% respiratory variability.  FINDINGS  Left Ventricle: The left ventricle has low normal systolic function, with an ejection fraction of 50-55%. The cavity size was normal. There is mildly increased left ventricular wall thickness. Left ventricular diastolic function could not be evaluated  secondary to atrial fibrillation. Indeterminate filling pressures No evidence of left ventricular regional wall motion abnormalities..  Right  Ventricle: The right ventricle has normal systolic function. The cavity was normal. There is no increase in right ventricular wall thickness. Right ventricular systolic pressure is moderately elevated.  Left Atrium: Left atrial size was normal in size.  Right Atrium: Right atrial size was moderately dilated.  Interatrial Septum: No atrial level shunt detected by color flow Doppler.  Pericardium: There is no evidence of pericardial effusion. There is a large pleural effusion in the left lateral region.  Mitral Valve: The mitral valve is normal in structure. There is mild to moderate mitral annular calcification present. Mitral valve regurgitation is trivial by color flow Doppler.  Tricuspid Valve: The tricuspid valve is normal in structure. Tricuspid valve regurgitation is trivial by color flow Doppler.  Aortic Valve: The aortic valve is normal in structure. Aortic valve regurgitation was not visualized by color flow Doppler.  Pulmonic Valve: The pulmonic valve was normal in structure. Pulmonic valve regurgitation is not visualized by color flow Doppler.  Aorta: The aorta is normal in size and structure.  Venous: The inferior vena cava measures 2.86 cm, is dilated in size with  less than 50% respiratory variability.  Discharge Instructions: Discharge Instructions    For home use only DME Hospital bed   Complete by: As directed    Length of Need: Lifetime   Bed type: Semi-electric   For home use only DME Other see comment   Complete by: As directed    Tub/shower seat   Length of Need: Lifetime   For home use only DME Walker rolling   Complete by: As directed    Patient needs a walker to treat with the following condition: Gait instability    - Continue to take your Furosemide 80mg  (two pills of 40mg  each) twice a day and then 40mg  (one pill) twice a day.   - Continue diltiazem 360mg  once a day and metoprolol tartrate 25mg  3 times/day.   - Continue your Eliquis 5mg  twice a day.   - We have made an appointment with the cardiologist for you. Your appointment is on 03/19/2019 at 1:00pm with Dr. Truett MainlandManish Patwardhan.    - Please make an appointment with your PCP for follow up.   One of your heart tests showed weakness of the heart muscle this admission. This may make you more susceptible to weight gain from fluid retention, which can lead to symptoms that we call heart failure. Please follow these special instructions:  1. Follow a low-salt diet - you are allowed no more than 2,000mg  of sodium per day. Watch your fluid intake. In general, you should not be taking in more than 2 liters of fluid per day (no more than 8 glasses per day). This includes sources of water in foods like soup, coffee, tea, milk, etc. 2. Weigh yourself on the same scale at same time of day and keep a log. 3. Call your doctor: (Anytime you feel any of the following symptoms)  - 3lb weight gain overnight or 5lb within a few days - Shortness of breath, with or without a dry hacking cough  - Swelling in the hands, feet or stomach  - If you have to sleep on extra pillows at night in order to breathe   IT IS IMPORTANT TO LET YOUR DOCTOR KNOW EARLY ON IF YOU ARE HAVING SYMPTOMS SO WE CAN HELP YOU!    Signed:  Eliezer BottomSadia Shed [facility] PGY-1 02/24/2019   10:59AM [phone]

## 2019-02-19 NOTE — Progress Notes (Signed)
Spoke with Pryor Montes with pt's permission regarding pt's plan of care.  Questions and concerns were answered.  Pryor Montes requested to have MD to call her. Will page MD.

## 2019-02-19 NOTE — Progress Notes (Signed)
Subjective:   Patient was resting in bed on exam with her feet elevated. She is disappointed when told she will need additional days in the hospital. She is reassured she is making good progress with diuresis. We discussed her heart rate continues to be elevated on 2 medications. The plan is to consult cardiology to discuss controlling the rate with medication adjustment vs possibility of a procedure to get her heart back in rhythm. We will continue to giver her lasix today.   Objective:  Vital signs in last 24 hours: Vitals:   02/18/19 1013 02/18/19 1154 02/18/19 1946 02/19/19 0519  BP:  (!) 126/91 (!) 129/96 (!) 131/109  Pulse:  (!) 51 (!) 104 67  Resp:  20 17 16   Temp:  98.6 F (37 C) 98.7 F (37.1 C) 98.3 F (36.8 C)  TempSrc:  Oral Oral Oral  SpO2: 96% 96% 97% 98%  Weight:    110.4 kg  Height:       Physical Exam Constitutional:      General: She is not in acute distress. Cardiovascular:     Rate and Rhythm: Normal rate. Rhythm irregular.  Pulmonary:     Breath sounds: Examination of the right-middle field reveals rales. Examination of the left-middle field reveals rales. Examination of the right-lower field reveals rales. Examination of the left-lower field reveals decreased breath sounds. Decreased breath sounds and rales present.  Musculoskeletal:     Right lower leg: She exhibits no tenderness. Edema present.     Left lower leg: She exhibits no tenderness. Edema present.  Skin:    General: Skin is warm.     Findings: No rash.  Neurological:     Mental Status: She is alert.  Psychiatric:        Mood and Affect: Mood is anxious.    Assessment/Plan:  Principal Problem:   Acute on chronic heart failure with preserved ejection fraction (HFpEF) (HCC) Active Problems:   Pleural effusion, left   Atrial fibrillation (HCC)  Sharon Acosta is a 63 year old female with past medical history of hypertension here with dyspnea and bilateral lower extremity edema likely  secondary to new diastolic heart failure.  HFpEF Left pulmonary effusion ECHO showed normal LVEF. Diastolic function was unable to be evaluated due to A-Fib. Patient remains on 2L. Continue to have dyspnea and remains edematous, but both are improving. S/P 1447mL therapeutic and diagnostic thoracentesis.  No malignant cells identified on cytolgoy of pleural fluid from throacentesis. Output ~3.2 L yesterday with Net -2.25 L, approximately Net -16.85 L recorded with diuresis. Weight today 111.1  from 125.6 admission. - Continuous pulse ox, keep O2 greater than 90%, wean oxygen as tolerated - Furosemide 40mg  IV, twice daily - Strict I's/O, fluid restriction, daily weights - BMP, CBC, Mg AM labs  #A. fib with RVR  Patient still in A. Fib. Rate on Telemetry above goal overnight, Goal < 110 , plan to consult cardiology for recommendations on managing with medications vs TTE cardioversion.   - Metoprolol XL 25 mg - Convert Diltiazem to 360mg  ER Daily - Eliquis 5 mg twice daily - Consult Cardiology  #Anxiety At this time patient's anxiety seems to be a normal response to being ill and in the hospital.. We will continue to monitor patient for anxiety. -Hydroxyzine 10 mg 3 times daily as needed  VTE prophx: Eliquis Diet: Low salt Bowel: Senokot Code: DNR  Dispo: Anticipated discharge in approximately 2-4 day(s).   Tamsen Snider, MD PGY1  [phone]

## 2019-02-19 NOTE — Progress Notes (Signed)
Occupational Therapy Treatment Patient Details Name: Sharon Acosta MRN: [id number] DOB: 06-19-1956 Today's Date: 02/19/2019    History of present illness Sharon Acosta is a 63 yo F w/ PMH of HTN presenting to [facility] w/ complaints of progressive leg swelling and shortness of breath.  Found to have A-fib w/RVR, CHF with anasarca and PNA.   OT comments  Pt is progressing towards OT goals this session. Initially very sad because "I have to stay through the weekend instead of going home". Able to complete BSC transfer, grooming at bed level after transfer back to bed, and established HEP for BUE with level 2 theraband. OT will continue to follow acutely. Next session Pt would really like to bathe and use a shower cap.    Follow Up Recommendations  No OT follow up;Supervision - Intermittent    Equipment Recommendations  Tub/shower seat;Other (comment)(hospital bed (she has a water bed))    Recommendations for Other Services      Precautions / Restrictions Precautions Precautions: Fall Precaution Comments: watch HR and O2 Restrictions Weight Bearing Restrictions: No       Mobility Bed Mobility Overal bed mobility: Modified Independent                Transfers Overall transfer level: Modified independent Equipment used: Rolling walker (2 wheeled)             General transfer comment: to [facility] and back to bed    Balance Overall balance assessment: Modified Independent(with RW)                                         ADL either performed or assessed with clinical judgement   ADL Overall ADL's : Needs assistance/impaired     Grooming: Wash/dry hands;Bed level;Wash/dry face Grooming Details (indicate cue type and reason): upon return to bed post-BSC transfer     Lower Body Bathing: Min guard;Sit to/from stand           Toilet Transfer: Min Engineer, materials Details (indicate cue type and reason): to  bathroom, HR elevated to 139 Toileting- Clothing Manipulation and Hygiene: Set up;Sit to/from stand Toileting - Clothing Manipulation Details (indicate cue type and reason): provided with warm wash cloth     Functional mobility during ADLs: Min guard;Rolling walker       Vision       Perception     Praxis      Cognition Arousal/Alertness: Awake/alert Behavior During Therapy: Flat affect Overall Cognitive Status: Within Functional Limits for tasks assessed                                 General Comments: Pt feeling down, disappointed that she will remain in hospital over the weekend        Exercises Exercises: General Upper Extremity General Exercises - Upper Extremity Shoulder Flexion: AROM;Strengthening;10 reps;Supine;Theraband Theraband Level (Shoulder Flexion): Level 2 (Red) Shoulder Extension: AROM;Strengthening;10 reps;Supine;Theraband Theraband Level (Shoulder Extension): Level 2 (Red) Shoulder ABduction: AROM;Strengthening;10 reps;Supine;Theraband Theraband Level (Shoulder Abduction): Level 2 (Red) Shoulder Horizontal ABduction: AROM;Strengthening;10 reps;Supine;Theraband Theraband Level (Shoulder Horizontal Abduction): Level 2 (Red) Shoulder Horizontal ADduction: AROM;Strengthening;10 reps;Supine;Theraband Theraband Level (Shoulder Horizontal Adduction): Level 2 (Red) Elbow Flexion: AROM;Strengthening;10 reps;Supine;Theraband(uses foot rail as stabilizer for theraband) Theraband Level (Elbow Flexion): Level 2 (Red)   Shoulder Instructions  General Comments O2 was 93-87% on 2L O2    Pertinent Vitals/ Pain       Pain Assessment: No/denies pain  Home Living                                          Prior Functioning/Environment              Frequency  Min 2X/week        Progress Toward Goals  OT Goals(current goals can now be found in the care plan section)  Progress towards OT goals: Progressing toward  goals  Acute Rehab OT Goals Patient Stated Goal: to go home with son OT Goal Formulation: With patient Time For Goal Achievement: 02/28/19 Potential to Achieve Goals: Good  Plan Discharge plan remains appropriate;Frequency remains appropriate    Co-evaluation                 AM-PAC OT "6 Clicks" Daily Activity     Outcome Measure   Help from another person eating meals?: None Help from another person taking care of personal grooming?: A Little Help from another person toileting, which includes using toliet, bedpan, or urinal?: A Little Help from another person bathing (including washing, rinsing, drying)?: A Little Help from another person to put on and taking off regular upper body clothing?: A Little Help from another person to put on and taking off regular lower body clothing?: A Little 6 Click Score: 19    End of Session Equipment Utilized During Treatment: Oxygen;Rolling walker(2L via [place])  OT Visit Diagnosis: Unsteadiness on feet (R26.81)   Activity Tolerance Patient limited by fatigue   Patient Left in bed;with call bell/phone within reach   Nurse Communication Mobility status        Time: [id number] OT Time Calculation (min): 29 min  Charges: OT General Charges $OT Visit: 1 Visit OT Treatments $Self Care/Home Management : 8-22 mins $Therapeutic Exercise: 8-22 mins  Sherryl MangesLaura Cliff Damiani OTR/L Acute Rehabilitation Services Pager: [phone] Office: [phone]   Evern BioLaura J Lucine Bilski 02/19/2019, 11:52 AM

## 2019-02-20 DIAGNOSIS — I272 Pulmonary hypertension, unspecified: Secondary | ICD-10-CM

## 2019-02-20 LAB — BASIC METABOLIC PANEL
Anion gap: 7 (ref 5–15)
BUN: 11 mg/dL (ref 8–23)
CO2: 34 mmol/L — ABNORMAL HIGH (ref 22–32)
Calcium: 8.2 mg/dL — ABNORMAL LOW (ref 8.9–10.3)
Chloride: 97 mmol/L — ABNORMAL LOW (ref 98–111)
Creatinine, Ser: 0.91 mg/dL (ref 0.44–1.00)
GFR calc Af Amer: >60 mL/min (ref >60)
GFR calc non Af Amer: >60 mL/min (ref >60)
Glucose, Bld: 90 mg/dL (ref 70–99)
Potassium: 4.2 mmol/L (ref 3.5–5.1)
Sodium: 138 mmol/L (ref 135–145)

## 2019-02-20 LAB — CBC
HCT: 49.8 % — ABNORMAL HIGH (ref 36.0–46.0)
Hemoglobin: 15.9 g/dL — ABNORMAL HIGH (ref 12.0–15.0)
MCH: 30.1 pg (ref 26.0–34.0)
MCHC: 31.9 g/dL (ref 30.0–36.0)
MCV: 94.1 fL (ref 80.0–100.0)
Platelets: 346 10*3/uL (ref 150–400)
RBC: 5.29 MIL/uL — ABNORMAL HIGH (ref 3.87–5.11)
RDW: 14.6 % (ref 11.5–15.5)
WBC: 9.5 10*3/uL (ref 4.0–10.5)
nRBC: 0 % (ref 0.0–0.2)

## 2019-02-20 NOTE — Progress Notes (Addendum)
Physical Therapy Treatment Patient Details Name: Sharon Acosta MRN: [id number] DOB: 29-Oct-1955 Today's Date: 02/20/2019    History of Present Illness Mrs.Frier is a 63 yo F w/ PMH of HTN presenting to [facility] w/ complaints of progressive leg swelling and shortness of breath.  Found to have A-fib w/RVR, CHF with anasarca and PNA.    PT Comments    Pt very eager to return home and frustrated with conversation with cardiologis as well as fact she remains in the hospital. Pt on 2L on arrival with SpO2 95% with drop to 85% with sitting EOB on RA and able to maintain 90-93% on 2L with gait. HR 72 at rest with rise to 150 with 200' of gait with monitoring and activity modification throughout to control and limit HR.     Follow Up Recommendations  No PT follow up     Equipment Recommendations  Rolling walker with 5" wheels    Recommendations for Other Services       Precautions / Restrictions Precautions Precautions: Fall Precaution Comments: watch HR and O2    Mobility  Bed Mobility Overal bed mobility: Modified Independent             General bed mobility comments: HOb elevated 30 degrees with use of rail  Transfers Overall transfer level: Modified independent               General transfer comment: pt able to stand from bed and [facility]  Ambulation/Gait Ambulation/Gait assistance: Supervision Gait Distance (Feet): 400 Feet Assistive device: Rolling walker (2 wheeled) Gait Pattern/deviations: Step-through pattern;Decreased stride length;Trunk flexed   Gait velocity interpretation: >2.62 ft/sec, indicative of community ambulatory General Gait Details: cues for posture and standing rest as pt with HR up to 150 after 200' with standing rest HR returned to 120 with remaining gait with HR of 135 or below. SpO2 95% on 2L with gait   Stairs             Wheelchair Mobility    Modified Rankin (Stroke Patients Only)       Balance Overall balance assessment: Needs  assistance   Sitting balance-Leahy Scale: Good     Standing balance support: Bilateral upper extremity supported Standing balance-Leahy Scale: Poor Standing balance comment: UE support for gait                            Cognition Arousal/Alertness: Awake/alert Behavior During Therapy: WFL for tasks assessed/performed Overall Cognitive Status: Within Functional Limits for tasks assessed                                        Exercises General Exercises - Lower Extremity Long Arc Quad: AROM;20 reps;Both;Seated Hip Flexion/Marching: AROM;20 reps;Both;Seated    General Comments        Pertinent Vitals/Pain Pain Assessment: No/denies pain    Home Living                      Prior Function            PT Goals (current goals can now be found in the care plan section) Progress towards PT goals: Progressing toward goals    Frequency           PT Plan Current plan remains appropriate    Co-evaluation  AM-PAC PT "6 Clicks" Mobility   Outcome Measure  Help needed turning from your back to your side while in a flat bed without using bedrails?: A Little Help needed moving from lying on your back to sitting on the side of a flat bed without using bedrails?: None Help needed moving to and from a bed to a chair (including a wheelchair)?: None Help needed standing up from a chair using your arms (e.g., wheelchair or bedside chair)?: None Help needed to walk in hospital room?: A Little Help needed climbing 3-5 steps with a railing? : A Little 6 Click Score: 21    End of Session Equipment Utilized During Treatment: Oxygen Activity Tolerance: Patient tolerated treatment well Patient left: in chair;with call bell/phone within reach Nurse Communication: Mobility status PT Visit Diagnosis: Muscle weakness (generalized) (M62.81);Difficulty in walking, not elsewhere classified (R26.2)     Time: [id number] PT Time  Calculation (min) (ACUTE ONLY): 30 min  Charges:  $Gait Training: 8-22 mins $Therapeutic Exercise: 8-22 mins                     Keshan Reha Abner Greenspanabor Sheletha Bow, PT Acute Rehabilitation Services Pager: [phone] Office: [phone]    Laree [name] B Lavanna Rog 02/20/2019, 1:52 PM

## 2019-02-20 NOTE — Progress Notes (Signed)
TEE + cardioversion discussed with the patient. Patient unsure abut TEE. Also, her poor dentition is concerning to safely perform TEE. Will continue to attempt rate control medically. Outpatient cardioversion could be safely performed without TEE in 3 weeks. Full consult note to follow.   Nigel Mormon, MD [facility] [place] Pager: [phone] Office: [phone] If no answer Cell [phone]

## 2019-02-20 NOTE — Progress Notes (Signed)
SATURATION QUALIFICATIONS: (This note is used to comply with regulatory documentation for home oxygen)  Patient Saturations on Room Air at Rest = 85%  Patient Saturations on 2L at rest = 92%  Patient Saturations on 2 Liters of oxygen while Ambulating = 90%  Please briefly explain why patient needs home oxygen: Pt requires supplemental oxygen to maintain saturations >90% at all times [place] Pager: [phone] Office: [phone]

## 2019-02-20 NOTE — Progress Notes (Addendum)
   Subjective: Patient sitting in chair on exam.  We reviewed her current treatment for heart failure and for A. fib.  She is concerned with how long it will be and how she is at her dry weight.  Team informs her that this cannot be predicted but she is making good progress.  Plans to continue rate control for A. Fib, follow-up as outpatient with cardiology , and possible cardioversion after being on anticoagulation for a month.  Objective:  Vital signs in last 24 hours: Vitals:   02/19/19 2036 02/20/19 0236 02/20/19 0504 02/20/19 1134  BP: 124/90  (!) 139/105 119/85  Pulse: 93  97 (!) 44  Resp: 20  20 18   Temp: 98.6 F (37 C)  98 F (36.7 C) 98.8 F (37.1 C)  TempSrc: Oral  Oral Oral  SpO2: 94%  98% 97%  Weight:  108.8 kg    Height:       Physical Exam Constitutional:      Appearance: She is well-developed.  Cardiovascular:     Rate and Rhythm: Normal rate. Rhythm irregular.  Pulmonary:     Breath sounds: Examination of the right-middle field reveals rales. Examination of the left-middle field reveals rales. Examination of the right-lower field reveals rales. Examination of the left-lower field reveals decreased breath sounds. Decreased breath sounds and rales present.  Musculoskeletal:     Right lower leg: Edema present.     Left lower leg: Edema present.  Skin:    General: Skin is warm and dry.  Neurological:     Mental Status: She is alert.      Assessment/Plan:  Principal Problem:   Acute on chronic heart failure with preserved ejection fraction (HFpEF) (HCC) Active Problems:   Pleural effusion, left   Atrial fibrillation (HCC)  catherina pates is a 63 year old female with past medical history of hypertension here with dyspnea and bilateral lower extremity edema likely secondary to new diastolic heart failure.  HFpEF Left pulmonary effusion ECHO showed normal LVEF. Diastolic function was unable to be evaluated due to A-Fib. S/P1417mLtherapeutic and  diagnosticthoracentesis. No malignant cells identified on cytolgoy of pleural fluid from throacentesis.Patientremains on 2L, desat to 85% on RA at rest w/ PT. She remains edematous, but improving with diuresis. In addition to   Output~3L yesterday with Net -2 L, approximately Net -16.85 L recordedwith diuresis. Weight today 108.8 from 125.6 admission. - Continuous pulse ox, keep O2 greater than 90%, wean oxygen as tolerated -Furosemide 40mg  IV, twice daily -Strict I's/O, fluid restriction, daily weights -BMP, CBC, Mg - AM labs  #A. fib with RVR  Patient still in A. Fib. Rate on Telemetry above goal overnight, Goal < 110. - appreciate Cardiology recommendations   - Metoprolol tartrate 25 mg TID - Diltiazem 360mg  ER Daily - Eliquis 5 mg twice daily   #Anxiety At this time patient's anxiety seems to be a normal response to being ill and in the hospital.. We will continue to monitor patient for anxiety. -Hydroxyzine 10 mg 3 times daily as needed  VTE prophx: Eliquis Diet: Low salt Bowel: Senokot Code: DNR  Family: Call daughter-in-law daily   Dispo: Anticipated discharge in approximately 2-4 day(s).    Tamsen Snider, MD PGY1  [phone]

## 2019-02-20 NOTE — Consult Note (Addendum)
Sharon ShengRebecca Acosta is an 63 y.o. female.   Chief Complaint: Atrial fibrillation HPI:   63 y.o. Caucasian female, with obesity former smoker, hypertension,  currently hospitalized with worsening shortness of breath and leg edema since 02/12/2019. Cardiology consulted for management of atrial fibrillation.   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 She has not been has regular health care at baseline. For last several weeks, she has had worsening shortness of breath and leg edema. She denies any chest pain. She has underwent nearly 20 L diuresis this hospital admission, including large volume thoracentesis for pleural effusion. Atrial fibrillation has been an ongoing issue this hospitalization, currently on metoprolol succinate 25 mg diltiazem 360 mg daily. She has RVR with minimal activity. She has been on apixaban 5 mg bid for last 5 days. Echocardiogram showed preserved EF, diastolic function not assessed due to Afib.    Past Medical History:  Diagnosis Date  . Acute on chronic heart failure with preserved ejection fraction (HCC) 01/2019  . Atrial fibrillation (HCC) 01/2019  . Pleural effusion     Past Surgical History:  Procedure Laterality Date  . WISDOM TOOTH EXTRACTION      History reviewed. No pertinent family history. Social History:  reports that she quit smoking about 2 weeks ago. Her smoking use included cigarettes. She has never used smokeless tobacco. She reports previous alcohol use. She reports that she does not use drugs.  Allergies: No Known Allergies  Review of Systems  Constitution: Positive for malaise/fatigue.  Cardiovascular: Positive for dyspnea on exertion and leg swelling.  Respiratory: Positive for shortness of breath.      Blood pressure (!) 125/97, pulse 76, temperature 97.8 F (36.6 C), temperature source Oral, resp. rate 20, height 5\' 7"  (1.702 m), weight 108.8 kg, SpO2 96 %. Body mass index is 37.56 kg/m.  Physical Exam  Constitutional: She is  oriented to person, place, and time. She appears well-developed and well-nourished. No distress.  HENT:  Head: Normocephalic and atraumatic.  Poor dentition  Eyes: Pupils are equal, round, and reactive to light. Conjunctivae are normal.  Neck: JVD present.  Cardiovascular: Normal rate, regular rhythm and intact distal pulses.  Pulmonary/Chest: Effort normal and breath sounds normal. She has no wheezes. She has no rales.  Abdominal: Soft. Bowel sounds are normal. There is no rebound.  Musculoskeletal:        General: Edema (2+) present.  Lymphadenopathy:    She has no cervical adenopathy.  Neurological: She is alert and oriented to person, place, and time. No cranial nerve deficit.  Skin: Skin is warm and dry.  Psychiatric: She has a normal mood and affect.  Nursing note and vitals reviewed.   Results for orders placed or performed during the hospital encounter of 02/12/19 (from the past 48 hour(s))  Basic metabolic panel     Status: Abnormal   Collection Time: 02/19/19  6:46 AM  Result Value Ref Range   Sodium 138 135 - 145 mmol/L   Potassium 4.0 3.5 - 5.1 mmol/L   Chloride 95 (L) 98 - 111 mmol/L   CO2 32 22 - 32 mmol/L   Glucose, Bld 102 (H) 70 - 99 mg/dL   BUN 13 8 - 23 mg/dL   Creatinine, Ser 9.600.99 0.44 - 1.00 mg/dL   Calcium 8.2 (L) 8.9 - 10.3 mg/dL   GFR calc non Af Amer >60 >60 mL/min   GFR calc Af Amer >60 >60 mL/min   Anion gap 11 5 - 15  Comment: Performed at [facility], [address] [place], [place] [address]  CBC     Status: Abnormal   Collection Time: 02/19/19  6:46 AM  Result Value Ref Range   WBC 10.3 4.0 - 10.5 K/uL   RBC 5.71 (H) 3.87 - 5.11 MIL/uL   Hemoglobin 17.1 (H) 12.0 - 15.0 g/dL   HCT 09.854.2 (H) 11.936.0 - 14.746.0 %   MCV 94.9 80.0 - 100.0 fL   MCH 29.9 26.0 - 34.0 pg   MCHC 31.5 30.0 - 36.0 g/dL   RDW 82.914.8 56.211.5 - 13.015.5 %   Platelets 364 150 - 400 K/uL   nRBC 0.0 0.0 - 0.2 %    Comment: Performed at [facility], [address]  [place], [place] [address]  CBC     Status: Abnormal   Collection Time: 02/20/19  5:50 AM  Result Value Ref Range   WBC 9.5 4.0 - 10.5 K/uL   RBC 5.29 (H) 3.87 - 5.11 MIL/uL   Hemoglobin 15.9 (H) 12.0 - 15.0 g/dL   HCT 46.949.8 (H) 62.936.0 - 52.846.0 %   MCV 94.1 80.0 - 100.0 fL   MCH 30.1 26.0 - 34.0 pg   MCHC 31.9 30.0 - 36.0 g/dL   RDW 41.314.6 24.411.5 - 01.015.5 %   Platelets 346 150 - 400 K/uL   nRBC 0.0 0.0 - 0.2 %    Comment: Performed at [facility], [address] [place], [place] [address]  Basic metabolic panel     Status: Abnormal   Collection Time: 02/20/19  5:50 AM  Result Value Ref Range   Sodium 138 135 - 145 mmol/L   Potassium 4.2 3.5 - 5.1 mmol/L   Chloride 97 (L) 98 - 111 mmol/L   CO2 34 (H) 22 - 32 mmol/L   Glucose, Bld 90 70 - 99 mg/dL   BUN 11 8 - 23 mg/dL   Creatinine, Ser 6.640.91 0.44 - 1.00 mg/dL   Calcium 8.2 (L) 8.9 - 10.3 mg/dL   GFR calc non Af Amer >60 >60 mL/min   GFR calc Af Amer >60 >60 mL/min   Anion gap 7 5 - 15    Comment: Performed at [facility], [address] [place], [place] [address]    Labs:   Lab Results  Component Value Date   WBC 9.5 02/20/2019   HGB 15.9 (H) 02/20/2019   HCT 49.8 (H) 02/20/2019   MCV 94.1 02/20/2019   PLT 346 02/20/2019    Recent Labs  Lab 02/14/19 0431  02/20/19 0550  NA 143   < > 138  K 4.2   < > 4.2  CL 101   < > 97*  CO2 29   < > 34*  BUN 13   < > 11  CREATININE 0.83   < > 0.91  CALCIUM 8.3*   < > 8.2*  PROT 5.7*  --   --   BILITOT 1.3*  --   --   ALKPHOS 63  --   --   ALT 23  --   --   AST 19  --   --   GLUCOSE 102*   < > 90   < > = values in this interval not displayed.    Lipid Panel     Component Value Date/Time   CHOL 97 02/13/2019 0842   TRIG 123 02/13/2019 0842   HDL 26 (L) 02/13/2019 0842   CHOLHDL 3.7 02/13/2019  0842   VLDL 25 02/13/2019 0842   LDLCALC 46 02/13/2019 0842    BNP (last 3 results) Recent Labs    02/12/19 1603  BNP 1,432.9*    HEMOGLOBIN A1C Lab Results   Component Value Date   HGBA1C 5.4 02/13/2019   MPG 108.28 02/13/2019    Cardiac Panel (last 3 results) No results for input(s): CKTOTAL, CKMB, TROPONINI, RELINDX in the last 8760 hours.  No results found for: CKTOTAL, CKMB, CKMBINDEX, TROPONINI   TSH Recent Labs    02/12/19 1605  TSH 1.652     Medications Prior to Admission  Medication Sig Dispense Refill  . ASHWAGANDHA PO Take 1 tablet by mouth daily with breakfast. Stress/Anxiety formulation    . Magnesium 250 MG TABS Take 250 mg by mouth daily with breakfast.    . naproxen sodium (ALEVE) 220 MG tablet Take 220-440 mg by mouth 2 (two) times daily as needed (for headaches or pain).    . Potassium 99 MG TABS Take 99 mg by mouth daily with breakfast.    . pyridOXINE (VITAMIN B-6) 100 MG tablet Take 100 mg by mouth daily with breakfast.        Current Facility-Administered Medications:  .  acetaminophen (TYLENOL) tablet 650 mg, 650 mg, Oral, Q6H PRN **OR** acetaminophen (TYLENOL) suppository 650 mg, 650 mg, Rectal, Q6H PRN, Mosetta Anis, MD .  albuterol (PROVENTIL) (2.5 MG/3ML) 0.083% nebulizer solution 2.5 mg, 2.5 mg, Nebulization, Q6H PRN, Mosetta Anis, MD .  apixaban Arne Cleveland) tablet 5 mg, 5 mg, Oral, BID, Madalyn Rob, MD, 5 mg at 02/20/19 2134 .  diltiazem (CARDIZEM CD) 24 hr capsule 360 mg, 360 mg, Oral, Daily, Neva Seat, MD, 360 mg at 02/20/19 1505 .  furosemide (LASIX) injection 40 mg, 40 mg, Intravenous, Q12H, Mosetta Anis, MD, 40 mg at 02/20/19 1654 .  hydrOXYzine (ATARAX/VISTARIL) tablet 10 mg, 10 mg, Oral, TID PRN, Madalyn Rob, MD, 10 mg at 02/13/19 1458 .  metoprolol tartrate (LOPRESSOR) tablet 25 mg, 25 mg, Oral, TID, Chundi, Vahini, MD, 25 mg at 02/20/19 2134 .  senna-docusate (Senokot-S) tablet 1 tablet, 1 tablet, Oral, QHS PRN, Mosetta Anis, MD   Today's Vitals   02/20/19 0900 02/20/19 1134 02/20/19 1658 02/20/19 2009  BP:  119/85 131/90 (!) 125/97  Pulse:  (!) 44 (!) 110 76  Resp:  18  20   Temp:  98.8 F (37.1 C)  97.8 F (36.6 C)  TempSrc:  Oral  Oral  SpO2:  97%  96%  Weight:      Height:      PainSc: 0-No pain      Body mass index is 37.56 kg/m.   CARDIAC STUDIES:  EKG 02/13/2019: Afib with RVR  Echocardiogram 02/13/2019:  1. The left ventricle has low normal systolic function, with an ejection fraction of 50-55%. The cavity size was normal. There is mildly increased left ventricular wall thickness. Left ventricular diastolic function could not be evaluated secondary to  atrial fibrillation. Indeterminate filling pressures No evidence of left ventricular regional wall motion abnormalities.  2. The right ventricle has normal systolic function. The cavity was normal. There is no increase in right ventricular wall thickness. Right ventricular systolic pressure is moderately elevated at 77mmHg.  3. Right atrial size was moderately dilated.  4. There is mild to moderate mitral annular calcification present.  5. The aorta is normal in size and structure.  6. Large pleural effusion in the left lateral region.  7. The inferior  vena cava was dilated in size with <50% respiratory variability.  Assessment/Plan  63 y.o. Caucasian female, with obesity former smoker, hypertension, now with persistent Afib, pulmonary hypertension.   Persistent Afib:  Poor rate control. Risk factors include possible COPD, obesity.She appears very deconditioned, which is likely contributing to poor rate control. CHA2DS2VAsc score She has been on eliquis for last 5 days. TEE + cardioversion discussed with the patient. Patient unsure abut TEE. Also, her poor dentition is concerning to safely perform TEE. Will continue to attempt rate control medically. Outpatient cardioversion could be safely performed without TEE in 3 weeks. Switch to metoprolol tartarate at 25 mg bitd.   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  Continue diuresis as you are doing.  CTA does  not show pulmonary embolism. She will benefit from outpatient sleep study.   Elder NegusManish J Norvin Ohlin, MD [facility]. [place] Pager: [phone] Office: [phone] If no answer: [phone]

## 2019-02-21 LAB — BASIC METABOLIC PANEL
Anion gap: 10 (ref 5–15)
BUN: 11 mg/dL (ref 8–23)
CO2: 30 mmol/L (ref 22–32)
Calcium: 8.5 mg/dL — ABNORMAL LOW (ref 8.9–10.3)
Chloride: 98 mmol/L (ref 98–111)
Creatinine, Ser: 0.85 mg/dL (ref 0.44–1.00)
GFR calc Af Amer: >60 mL/min (ref >60)
GFR calc non Af Amer: >60 mL/min (ref >60)
Glucose, Bld: 92 mg/dL (ref 70–99)
Potassium: 4.2 mmol/L (ref 3.5–5.1)
Sodium: 138 mmol/L (ref 135–145)

## 2019-02-21 LAB — MAGNESIUM: Magnesium: 2.2 mg/dL (ref 1.7–2.4)

## 2019-02-21 MED ORDER — PNEUMOCOCCAL VAC POLYVALENT 25 MCG/0.5ML IJ INJ
0.5000 mL | INJECTION | INTRAMUSCULAR | Status: DC
  Filled 2019-02-21: qty 0.5

## 2019-02-21 NOTE — Plan of Care (Signed)
  Problem: Clinical Measurements: Goal: Ability to maintain clinical measurements within normal limits will improve Outcome: Progressing Goal: Cardiovascular complication will be avoided Outcome: Progressing   Problem: Activity: Goal: Risk for activity intolerance will decrease Outcome: Progressing   Problem: Coping: Goal: Level of anxiety will decrease Outcome: Progressing   Problem: Education: Goal: Ability to demonstrate management of disease process will improve Outcome: Progressing Goal: Ability to verbalize understanding of medication therapies will improve Outcome: Progressing   Problem: Cardiac: Goal: Ability to achieve and maintain adequate cardiopulmonary perfusion will improve Outcome: Progressing

## 2019-02-21 NOTE — Progress Notes (Signed)
   Subjective: Pt continues to have improvement in dyspnea.  No cp.  She is anxious to leave but understands the need for continued diuresis.    Objective:  Vital signs in last 24 hours: Vitals:   02/21/19 0432 02/21/19 0433 02/21/19 0926 02/21/19 1221  BP:  (!) 134/107 131/87 137/88  Pulse:  96 (!) 109 71  Resp:  20 20 20   Temp:  98.5 F (36.9 C) 98.3 F (36.8 C) 98.5 F (36.9 C)  TempSrc:  Oral Oral Oral  SpO2:  97% 95% 96%  Weight: 107.7 kg     Height:       Cardiac: JVD elevated, normal rate and rhythm, clear s1 and s2, no murmurs, rubs or gallops, compression stockings in place with improvement in LE edema Pulmonary: Decreased basilar breath sounds, no increased WOB Abdominal: non distended abdomen, soft and nontender Psych: Alert, conversant, in good spirits   Assessment/Plan:  Principal Problem:   Acute on chronic heart failure with preserved ejection fraction (HFpEF) (HCC) Active Problems:   Pleural effusion, left   Atrial fibrillation (HCC)  HFpEF Left pulmonary effusion Diuresing well, functional status improvement, still has O2 requirement and volume overload.    -continue IV lasix 40 BID    #A. fib with RVR  Patient still in A. Fib. Rate has improved on increased metoprolol dosage. - appreciate Cardiology recommendations   - Metoprolol tartrate 25 mg TID - Diltiazem 360mg  ER Daily - Eliquis 5 mg twice daily   Dispo: Anticipated discharge in approximately 2-3 day(s).   Katherine Roan, MD 02/21/2019, 1:21 PM Vickki Muff MD PGY-3 Internal Medicine Pager # [phone]

## 2019-02-21 NOTE — Progress Notes (Signed)
Rate well controlled on current medical therapy. Will arrange outpatient follow up.  Nigel Mormon, MD [facility] [place] Pager: [phone] Office: [phone] If no answer Cell [phone]

## 2019-02-22 LAB — BASIC METABOLIC PANEL
Anion gap: 9 (ref 5–15)
BUN: 11 mg/dL (ref 8–23)
CO2: 29 mmol/L (ref 22–32)
Calcium: 8.2 mg/dL — ABNORMAL LOW (ref 8.9–10.3)
Chloride: 101 mmol/L (ref 98–111)
Creatinine, Ser: 0.75 mg/dL (ref 0.44–1.00)
GFR calc Af Amer: >60 mL/min (ref >60)
GFR calc non Af Amer: >60 mL/min (ref >60)
Glucose, Bld: 88 mg/dL (ref 70–99)
Potassium: 3.8 mmol/L (ref 3.5–5.1)
Sodium: 139 mmol/L (ref 135–145)

## 2019-02-22 MED ORDER — FUROSEMIDE 10 MG/ML IJ SOLN
20.0000 mg | Freq: Once | INTRAMUSCULAR | Status: AC
  Administered 2019-02-22: 12:00:00 20 mg via INTRAVENOUS
  Filled 2019-02-22: qty 2

## 2019-02-22 NOTE — Plan of Care (Signed)
  Problem: Clinical Measurements: Goal: Ability to maintain clinical measurements within normal limits will improve Outcome: Progressing Goal: Cardiovascular complication will be avoided Outcome: Progressing   Problem: Activity: Goal: Risk for activity intolerance will decrease Outcome: Progressing   Problem: Coping: Goal: Level of anxiety will decrease Outcome: Progressing   Problem: Education: Goal: Ability to demonstrate management of disease process will improve Outcome: Progressing Goal: Ability to verbalize understanding of medication therapies will improve Outcome: Progressing   Problem: Cardiac: Goal: Ability to achieve and maintain adequate cardiopulmonary perfusion will improve Outcome: Progressing   

## 2019-02-22 NOTE — Progress Notes (Signed)
   Subjective: Patient examined at bedside this AM. Patient was sitting comfortably in chair on 2L [place]. Patient reports no acute concerns. She reports improvement in dyspnea and bilateral lower extremity swelling.   Objective:  Vital signs in last 24 hours: Vitals:   02/21/19 2243 02/21/19 2245 02/22/19 0422 02/22/19 0816  BP: (!) 141/118  (!) 117/97 123/82  Pulse: (!) 56 (!) 102 90 (!) 55  Resp:   18 18  Temp:   98.3 F (36.8 C) 98.4 F (36.9 C)  TempSrc:   Oral Oral  SpO2: 95%  97% 97%  Weight:   106.5 kg   Height:       Physical Exam  Constitutional: She is oriented to person, place, and time and well-developed, well-nourished, and in no distress.  Cardiovascular: Normal rate and intact distal pulses. An irregularly irregular rhythm present. Exam reveals no gallop and no friction rub.  No murmur heard. JVD present to mid neck at 30deg elevation   Pulmonary/Chest: Effort normal. No accessory muscle usage. No respiratory distress.  Crackles present at bilateral lung bases to mid lung   Musculoskeletal: Normal range of motion.        General: Edema present.     Comments: 2+ pitting edema present to mid shin   Neurological: She is alert and oriented to person, place, and time.     Assessment/Plan:  Principal Problem:   Acute on chronic heart failure with preserved ejection fraction (HFpEF) (HCC) Active Problems:   Pleural effusion, left   Atrial fibrillation (HCC)  HFpEF Left pulmonary effusion Patient remains on 2L  with good O2 saturation. Admission weight 125.6kg on admission, today 106.5kg. Net  -3.6L in 24hours. Patient still has volume overload but improving with diuresis  - IV Lasix 60mg  in AM, 40mg  in PM  - Strict I/O monitoring   A. fib with RVR  Patient still in A. Fib. Ratehas improved on increased metoprolol dosage to 70-80s.  - Metoprololtartrate 25 mg TID -Diltiazem 360mg  ER Daily - Eliquis 5 mg twice daily - Per cardiology recs, outpatient  cardioversion after 1 month of anticoagulation   Anxiety At this time, patient's anxiety seems to be well controlled.   -Hydroxyzine 10 mg 3 times daily as needed   DVT Prophylaxis: Eliquis 5mg  bid Diet: Low salt   Dispo: Anticipated discharge in approximately 1-2 day(s).   Harvie Heck, MD  PGY-1 Internal Medicine  02/22/2019, 10:31 AM Pager: [phone]

## 2019-02-23 LAB — BASIC METABOLIC PANEL
Anion gap: 10 (ref 5–15)
BUN: 11 mg/dL (ref 8–23)
CO2: 28 mmol/L (ref 22–32)
Calcium: 8.2 mg/dL — ABNORMAL LOW (ref 8.9–10.3)
Chloride: 98 mmol/L (ref 98–111)
Creatinine, Ser: 0.9 mg/dL (ref 0.44–1.00)
GFR calc Af Amer: >60 mL/min (ref >60)
GFR calc non Af Amer: >60 mL/min (ref >60)
Glucose, Bld: 91 mg/dL (ref 70–99)
Potassium: 3.9 mmol/L (ref 3.5–5.1)
Sodium: 136 mmol/L (ref 135–145)

## 2019-02-23 MED ORDER — PNEUMOCOCCAL VAC POLYVALENT 25 MCG/0.5ML IJ INJ
0.5000 mL | INJECTION | INTRAMUSCULAR | Status: AC
  Administered 2019-02-23: 0.5 mL via INTRAMUSCULAR
  Filled 2019-02-23: qty 0.5

## 2019-02-23 MED ORDER — FUROSEMIDE 40 MG PO TABS
40.0000 mg | ORAL_TABLET | Freq: Two times a day (BID) | ORAL | Status: DC
  Administered 2019-02-23 – 2019-02-24 (×2): 40 mg via ORAL
  Filled 2019-02-23 (×2): qty 1

## 2019-02-23 NOTE — Progress Notes (Signed)
Spoke with daughter in law Chrys Racer, would like hospital bed and equipment delivered to her house as patient will be staying with her for several weeks.

## 2019-02-23 NOTE — Progress Notes (Signed)
SATURATION QUALIFICATIONS: (This note is used to comply with regulatory documentation for home oxygen) ? ?Patient Saturations on Room Air at Rest = 93% ? ?Patient Saturations on Room Air while Ambulating = 91% ? ? ?
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
## 2019-02-23 NOTE — Progress Notes (Signed)
  Date: 02/23/2019  Patient name: Sharon Acosta  Medical record number: [id number]  Date of birth: 08-18-1955   I have seen and evaluated this patient and I have discussed the plan of care with the house staff. Please see Dr. Margy Clarks note for complete details. I concur with her findings and plan.  Will transition to oral lasix today.  She is doing much better compared with admission.  Legs are showing improved swelling and no further pitting.  Will likely be ready for discharge in 1-2 days.   Sid Falcon, MD 02/23/2019, 12:10 PM

## 2019-02-23 NOTE — Progress Notes (Addendum)
Occupational Therapy Treatment Patient Details Name: Sharon Acosta MRN: [id number] DOB: 14-Jan-1956 Today's Date: 02/23/2019    History of present illness Sharon Acosta is a 63 yo F w/ PMH of HTN presenting to [facility] w/ complaints of progressive leg swelling and shortness of breath.  Found to have A-fib w/RVR, CHF with anasarca and PNA.   OT comments  Patient seated in recliner and agreeable to OT.  Educated on shower transfers, energy conservation and safety. Supervision for mobility and shower transfers using RW. Plans to dc home with sons support.  Provided energy conservation handout, pt verbalizes understanding. Reports no need for hospital bed, she plans on using a regular bed (not her water bed) and notified CM.  Will follow acutely.    Follow Up Recommendations  No OT follow up;Supervision - Intermittent    Equipment Recommendations  3 in 1 bedside commode    Recommendations for Other Services      Precautions / Restrictions Precautions Precautions: Fall Precaution Comments: watch HR and O2 Restrictions Weight Bearing Restrictions: No       Mobility Bed Mobility               General bed mobility comments: OOB upon entry   Transfers Overall transfer level: Modified independent Equipment used: Rolling walker (2 wheeled)             General transfer comment: no assist, from recliner     Balance Overall balance assessment: Needs assistance Sitting-balance support: No upper extremity supported;Feet supported Sitting balance-Leahy Scale: Good     Standing balance support: Bilateral upper extremity supported;During functional activity Standing balance-Leahy Scale: Fair Standing balance comment: reliant on B UE support dynamically                           ADL either performed or assessed with clinical judgement   ADL Overall ADL's : Needs assistance/impaired                                 Tub/ Shower Transfer:  Supervision/safety;Ambulation;Shower seat;Rolling walker;Walk-in Lobbyist Details (indicate cue type and reason): reviewed safe techniques for shower transfers, reports grabbars available but unsure where--therefore educated on use of RW for support stepping over threshold  Functional mobility during ADLs: Supervision/safety;Rolling walker General ADL Comments: reviewed energy conservation techniques and provided handout      Vision       Perception     Praxis      Cognition Arousal/Alertness: Awake/alert Behavior During Therapy: WFL for tasks assessed/performed Overall Cognitive Status: Within Functional Limits for tasks assessed                                          Exercises     Shoulder Instructions       General Comments VSS     Pertinent Vitals/ Pain       Pain Assessment: No/denies pain  Home Living                                          Prior Functioning/Environment              Frequency  Min 2X/week  Progress Toward Goals  OT Goals(current goals can now be found in the care plan section)  Progress towards OT goals: Progressing toward goals  Acute Rehab OT Goals Patient Stated Goal: to go home with son OT Goal Formulation: With patient  Plan Discharge plan remains appropriate;Frequency remains appropriate    Co-evaluation                 AM-PAC OT "6 Clicks" Daily Activity     Outcome Measure   Help from another person eating meals?: None Help from another person taking care of personal grooming?: None Help from another person toileting, which includes using toliet, bedpan, or urinal?: None Help from another person bathing (including washing, rinsing, drying)?: A Little Help from another person to put on and taking off regular upper body clothing?: None Help from another person to put on and taking off regular lower body clothing?: A Little 6 Click Score: 22    End  of Session Equipment Utilized During Treatment: Rolling walker  OT Visit Diagnosis: Unsteadiness on feet (R26.81)   Activity Tolerance Patient tolerated treatment well   Patient Left in chair;with call bell/phone within reach   Nurse Communication Mobility status        Time: [id number] OT Time Calculation (min): 22 min  Charges: OT General Charges $OT Visit: 1 Visit OT Treatments $Self Care/Home Management : 8-22 mins  Chancy Milroyhristie S Libni Fusaro, OT Acute Rehabilitation Services Pager [phone] Office [phone]     Chancy MilroyChristie S Sheena Donegan 02/23/2019, 3:54 PM

## 2019-02-23 NOTE — Care Management (Signed)
Ordered walker, 3 in 1 , hospital bed , through [facility]. Asked Zack with [employer] to call daughter in law Pryor Montes [phone] with any cost/ co pay .   Magdalen Spatz RN [phone]

## 2019-02-23 NOTE — Progress Notes (Addendum)
   Subjective: Patient examined at bedside this AM with team. She reports feeling better and is comfortable on room air. Discussed the plan to switch to oral medications, observation and discharge. Patient expressed that she does not want to be discharged until she is medically optimized. Patient also expressed interest in getting the pneumococcal vaccine.    Objective:  Vital signs in last 24 hours: Vitals:   02/22/19 0816 02/22/19 1135 02/22/19 1929 02/23/19 0504  BP: 123/82 121/77 137/89 (!) 129/98  Pulse: (!) 55 86 (!) 54 71  Resp: 18 18 18 18   Temp: 98.4 F (36.9 C) 99.1 F (37.3 C) 98.6 F (37 C) 98.9 F (37.2 C)  TempSrc: Oral Oral Oral Oral  SpO2: 97% 94% 93% 93%  Weight:    104.9 kg  Height:       Physical Exam  Constitutional: She is oriented to person, place, and time and well-developed, well-nourished, and in no distress.  Cardiovascular: Normal rate and intact distal pulses. An irregularly irregular rhythm present. Exam reveals no gallop and no friction rub.  No murmur heard. +JVD; minimal lower extremity edema   Pulmonary/Chest: Effort normal and breath sounds normal. No respiratory distress. She has no wheezes.  Bibasilar crackles present   Neurological: She is alert and oriented to person, place, and time.    Assessment/Plan:  Principal Problem:   Acute on chronic heart failure with preserved ejection fraction (HFpEF) (HCC) Active Problems:   Pleural effusion, left   Atrial fibrillation (HCC)  HFpEF Left pulmonary effusion Patient is on room air with O2 saturation >90% . Admission weight 125.6kg on admission, today 104.9kg. Net  ~-2L in 24hours. Patient's volume status improving. Lower extremity edema and JVD improved. Minimal crackles at bases present on examination.   - Transition to PO Lasix 40mg  bid  - Strict I/O monitoring   A. fib with RVR  Patient still in A. Fib. Rateon metoprolol dosage in mid-70s.   - Metoprololtartrate 25 mg TID  -Diltiazem 360mg  ER Daily - Eliquis 5 mg twice daily - Per cardiology recs, outpatient cardioversion after 1 month of anticoagulation   Anxiety At this time, patient's anxiety seems to be well controlled.   -Hydroxyzine 10 mg 3 times daily as needed   DVT Prophylaxis: Eliquis 5mg  bid Diet: Low salt  Dispo: Anticipated discharge in approximately 1 day(s). On discharge, will need rolling walker, hospital bed and tub/shower seat per PT/OT recommendations. Per daughter in law, patient will live with son and daughter in law for a while as she awaits renovations to make her own handicap friendly.   Harvie Heck, MD  Internal Medicine, PGY-1 02/23/2019, 7:07 AM Pager: [phone]

## 2019-02-24 LAB — CBC
HCT: 49.5 % — ABNORMAL HIGH (ref 36.0–46.0)
Hemoglobin: 16.4 g/dL — ABNORMAL HIGH (ref 12.0–15.0)
MCH: 30.3 pg (ref 26.0–34.0)
MCHC: 33.1 g/dL (ref 30.0–36.0)
MCV: 91.3 fL (ref 80.0–100.0)
Platelets: 429 10*3/uL — ABNORMAL HIGH (ref 150–400)
RBC: 5.42 MIL/uL — ABNORMAL HIGH (ref 3.87–5.11)
RDW: 14.6 % (ref 11.5–15.5)
WBC: 10.2 10*3/uL (ref 4.0–10.5)
nRBC: 0 % (ref 0.0–0.2)

## 2019-02-24 LAB — BASIC METABOLIC PANEL
Anion gap: 12 (ref 5–15)
BUN: 13 mg/dL (ref 8–23)
CO2: 25 mmol/L (ref 22–32)
Calcium: 8.4 mg/dL — ABNORMAL LOW (ref 8.9–10.3)
Chloride: 100 mmol/L (ref 98–111)
Creatinine, Ser: 0.83 mg/dL (ref 0.44–1.00)
GFR calc Af Amer: >60 mL/min (ref >60)
GFR calc non Af Amer: >60 mL/min (ref >60)
Glucose, Bld: 126 mg/dL — ABNORMAL HIGH (ref 70–99)
Potassium: 4.1 mmol/L (ref 3.5–5.1)
Sodium: 137 mmol/L (ref 135–145)

## 2019-02-24 MED ORDER — METOPROLOL TARTRATE 25 MG PO TABS: 25.0000 mg | ORAL_TABLET | Freq: Three times a day (TID) | ORAL | 0 refills | Status: DC

## 2019-02-24 MED ORDER — DILTIAZEM HCL ER COATED BEADS 360 MG PO CP24: 360.0000 mg | ORAL_CAPSULE | Freq: Every day | ORAL | 0 refills | Status: DC

## 2019-02-24 MED ORDER — APIXABAN 5 MG PO TABS: 5.0000 mg | ORAL_TABLET | Freq: Two times a day (BID) | ORAL | 0 refills | Status: DC

## 2019-02-24 MED ORDER — SENNOSIDES-DOCUSATE SODIUM 8.6-50 MG PO TABS: 1.0000 | ORAL_TABLET | Freq: Every evening | ORAL | 0 refills | Status: DC | PRN

## 2019-02-24 MED ORDER — FUROSEMIDE 40 MG PO TABS: 40.0000 mg | ORAL_TABLET | Freq: Two times a day (BID) | ORAL | 0 refills | Status: DC

## 2019-02-24 MED ORDER — HYDROXYZINE HCL 10 MG PO TABS: 10.0000 mg | ORAL_TABLET | Freq: Three times a day (TID) | ORAL | 0 refills | Status: DC | PRN

## 2019-02-24 MED ORDER — FUROSEMIDE 40 MG PO TABS: ORAL_TABLET | ORAL | 0 refills | Status: DC

## 2019-02-24 MED ORDER — ALBUTEROL SULFATE (2.5 MG/3ML) 0.083% IN NEBU: 2.5000 mg | INHALATION_SOLUTION | Freq: Four times a day (QID) | RESPIRATORY_TRACT | 12 refills | Status: DC | PRN
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
## 2019-02-24 NOTE — Progress Notes (Signed)
Reviewed new medication list with patient, teachback complete.  Discussed home care and self monitoring, diet, etc. Pt is stable and desires to be discharge with her son and daughter in law, Chrys Racer. She will be staying with them. Equipment delivered to son's home. IV removed and pt wheeled to main entrance to family's car.F/U appts in place pt states she will keep appts.

## 2019-02-24 NOTE — Progress Notes (Signed)
Student Pharmacist rounding with IMTS/B2/Lane service was requested to counsel patient on all medications prior to discharge. At the bedside, patient and I discussed her new medications including drug, indication, dosage, number of tablets to take, timing of administration, potential side effects and what to do if she misses a dose. Patient was able to ask and have answered any questions she had regarding her medication and disease state. Extensive counseling was provided regarding signs and symptoms of "blood being too thin" including: blood in stool, urine, vomiting blood, nose bleeds, coughing up blood, etc. Patient was also counseled on her higher Lasix (furosemide) dose for which she will take two tablets (80mg ) twice daily for two days. After which she will return to taking 1 tablet (40mg ) twice daily. Importance of medication compliance was stressed as well as the need for follow-up visits. Patient verbalized her understanding of the instructions provided.   Machias
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
## 2019-03-19 ENCOUNTER — Other Ambulatory Visit: Payer: Self-pay

## 2019-03-19 ENCOUNTER — Ambulatory Visit (INDEPENDENT_AMBULATORY_CARE_PROVIDER_SITE_OTHER): Payer: BC Managed Care – PPO | Admitting: Cardiology

## 2019-03-19 ENCOUNTER — Encounter: Payer: Self-pay | Admitting: Cardiology

## 2019-03-19 VITALS — BP 117/95 | HR 61 | Ht 67.0 in | Wt 223.0 lb

## 2019-03-19 DIAGNOSIS — I4819 Other persistent atrial fibrillation: Secondary | ICD-10-CM | POA: Diagnosis not present

## 2019-03-19 DIAGNOSIS — I272 Pulmonary hypertension, unspecified: Secondary | ICD-10-CM

## 2019-03-19 DIAGNOSIS — I5032 Chronic diastolic (congestive) heart failure: Secondary | ICD-10-CM | POA: Diagnosis not present

## 2019-03-19 MED ORDER — METOPROLOL TARTRATE 25 MG PO TABS: 25.0000 mg | ORAL_TABLET | Freq: Three times a day (TID) | ORAL | 2 refills | Status: DC

## 2019-03-19 MED ORDER — DILTIAZEM HCL ER COATED BEADS 360 MG PO CP24: 360.0000 mg | ORAL_CAPSULE | Freq: Every day | ORAL | 2 refills | Status: DC

## 2019-03-19 MED ORDER — APIXABAN 5 MG PO TABS: 5.0000 mg | ORAL_TABLET | Freq: Two times a day (BID) | ORAL | 2 refills | Status: DC
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
## 2019-03-19 NOTE — Addendum Note (Signed)
Addended by: Nigel Mormon on: 03/19/2019 04:42 PM   Modules accepted: Orders
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
## 2019-03-20 ENCOUNTER — Inpatient Hospital Stay (HOSPITAL_COMMUNITY): Admission: RE | Admit: 2019-03-20 | Payer: Self-pay | Source: Ambulatory Visit

## 2019-03-20 NOTE — Progress Notes (Signed)
Patient was called because her COVID test has been scheduled too early for her procedure.  Per Anesthesia the test needs to be completed no earlier than 4 days prior to her procedure.  Patient was scheduled to have her blood work today at [facility].  Patient was frustrated because the office informed her that she would be able to do both blood work and COVID test today.  I made the suggestion to contact the cardiology office to reschedule her appointment for Monday for labs and have her covid test on Monday since the patient is coming from [facility]

## 2019-03-23 ENCOUNTER — Other Ambulatory Visit (HOSPITAL_COMMUNITY)
Admission: RE | Admit: 2019-03-23 | Discharge: 2019-03-23 | Disposition: A | Discharge status: Home / Self Care | Payer: BC Managed Care – PPO | Source: Ambulatory Visit | Attending: Cardiology | Admitting: Cardiology

## 2019-03-23 ENCOUNTER — Other Ambulatory Visit (HOSPITAL_COMMUNITY): Payer: Self-pay | Admitting: Cardiology

## 2019-03-23 DIAGNOSIS — Z01812 Encounter for preprocedural laboratory examination: Secondary | ICD-10-CM | POA: Diagnosis not present

## 2019-03-23 DIAGNOSIS — Z20828 Contact with and (suspected) exposure to other viral communicable diseases: Secondary | ICD-10-CM | POA: Diagnosis not present

## 2019-03-23 LAB — SARS CORONAVIRUS 2 (TAT 6-24 HRS): SARS Coronavirus 2: NEGATIVE

## 2019-03-24 LAB — BASIC METABOLIC PANEL
BUN/Creatinine Ratio: 19 (ref 12–28)
BUN: 19 mg/dL (ref 8–27)
CO2: 27 mmol/L (ref 20–29)
Calcium: 9.3 mg/dL (ref 8.7–10.3)
Chloride: 97 mmol/L (ref 96–106)
Creatinine, Ser: 1.01 mg/dL — ABNORMAL HIGH (ref 0.57–1.00)
GFR calc Af Amer: 68 mL/min/{1.73_m2} (ref >59)
GFR calc non Af Amer: 59 mL/min/{1.73_m2} — ABNORMAL LOW (ref >59)
Glucose: 89 mg/dL (ref 65–99)
Potassium: 4.7 mmol/L (ref 3.5–5.2)
Sodium: 137 mmol/L (ref 134–144)

## 2019-03-24 NOTE — Anesthesia Preprocedure Evaluation (Addendum)
Anesthesia Evaluation  Patient identified by MRN, date of birth, ID band Patient awake    Reviewed: Allergy & Precautions, H&P , NPO status , Patient's Chart, lab work & pertinent test results  Airway Mallampati: III  TM Distance: >3 FB Neck ROM: Full    Dental no notable dental hx. (+) Poor Dentition, Dental Advisory Given   Pulmonary former smoker,    Pulmonary exam normal breath sounds clear to auscultation       Cardiovascular Exercise Tolerance: Good +CHF  + dysrhythmias Atrial Fibrillation  Rhythm:Irregular Rate:Normal     Neuro/Psych negative neurological ROS  negative psych ROS   GI/Hepatic negative GI ROS, Neg liver ROS,   Endo/Other  negative endocrine ROS  Renal/GU negative Renal ROS  negative genitourinary   Musculoskeletal   Abdominal   Peds  Hematology negative hematology ROS (+)   Anesthesia Other Findings   Reproductive/Obstetrics negative OB ROS                            Anesthesia Physical Anesthesia Plan  ASA: III  Anesthesia Plan: General   Post-op Pain Management:    Induction: Intravenous  PONV Risk Score and Plan: 3 and Propofol infusion and Treatment may vary due to age or medical condition  Airway Management Planned: Mask  Additional Equipment:   Intra-op Plan:   Post-operative Plan:   Informed Consent: I have reviewed the patients History and Physical, chart, labs and discussed the procedure including the risks, benefits and alternatives for the proposed anesthesia with the patient or authorized representative who has indicated his/her understanding and acceptance.     Dental advisory given  Plan Discussed with: CRNA  Anesthesia Plan Comments:         Anesthesia Quick Evaluation

## 2019-03-25 ENCOUNTER — Encounter (HOSPITAL_COMMUNITY): Payer: Self-pay | Admitting: *Deleted

## 2019-03-25 ENCOUNTER — Ambulatory Visit (HOSPITAL_COMMUNITY): Payer: BC Managed Care – PPO | Admitting: Anesthesiology

## 2019-03-25 ENCOUNTER — Encounter (HOSPITAL_COMMUNITY)
Admission: RE | Disposition: A | Discharge status: Home / Self Care | Payer: Self-pay | Source: Home / Self Care | Attending: Cardiology

## 2019-03-25 ENCOUNTER — Ambulatory Visit (HOSPITAL_COMMUNITY)
Admission: RE | Admit: 2019-03-25 | Discharge: 2019-03-25 | Disposition: A | Discharge status: Home / Self Care | Payer: BC Managed Care – PPO | Attending: Cardiology | Admitting: Cardiology

## 2019-03-25 DIAGNOSIS — I110 Hypertensive heart disease with heart failure: Secondary | ICD-10-CM | POA: Diagnosis not present

## 2019-03-25 DIAGNOSIS — Z8249 Family history of ischemic heart disease and other diseases of the circulatory system: Secondary | ICD-10-CM | POA: Insufficient documentation

## 2019-03-25 DIAGNOSIS — E669 Obesity, unspecified: Secondary | ICD-10-CM | POA: Diagnosis not present

## 2019-03-25 DIAGNOSIS — Z7901 Long term (current) use of anticoagulants: Secondary | ICD-10-CM | POA: Insufficient documentation

## 2019-03-25 DIAGNOSIS — Z79899 Other long term (current) drug therapy: Secondary | ICD-10-CM | POA: Diagnosis not present

## 2019-03-25 DIAGNOSIS — Z87891 Personal history of nicotine dependence: Secondary | ICD-10-CM | POA: Insufficient documentation

## 2019-03-25 DIAGNOSIS — I5032 Chronic diastolic (congestive) heart failure: Secondary | ICD-10-CM | POA: Diagnosis not present

## 2019-03-25 DIAGNOSIS — I272 Pulmonary hypertension, unspecified: Secondary | ICD-10-CM | POA: Insufficient documentation

## 2019-03-25 DIAGNOSIS — Z6834 Body mass index (BMI) 34.0-34.9, adult: Secondary | ICD-10-CM | POA: Diagnosis not present

## 2019-03-25 DIAGNOSIS — I4819 Other persistent atrial fibrillation: Secondary | ICD-10-CM | POA: Insufficient documentation

## 2019-03-25 HISTORY — PX: CARDIOVERSION: SHX1299

## 2019-03-25 LAB — POCT I-STAT, CHEM 8
BUN: 22 mg/dL (ref 8–23)
Calcium, Ion: 1.14 mmol/L — ABNORMAL LOW (ref 1.15–1.40)
Chloride: 101 mmol/L (ref 98–111)
Creatinine, Ser: 0.9 mg/dL (ref 0.44–1.00)
Glucose, Bld: 97 mg/dL (ref 70–99)
HCT: 55 % — ABNORMAL HIGH (ref 36.0–46.0)
Hemoglobin: 18.7 g/dL — ABNORMAL HIGH (ref 12.0–15.0)
Potassium: 4.2 mmol/L (ref 3.5–5.1)
Sodium: 142 mmol/L (ref 135–145)
TCO2: 28 mmol/L (ref 22–32)

## 2019-03-25 SURGERY — CARDIOVERSION
Anesthesia: General

## 2019-03-25 MED ORDER — LIDOCAINE 2% (20 MG/ML) 5 ML SYRINGE
INTRAMUSCULAR | Status: DC | PRN
  Administered 2019-03-25: 60 mg via INTRAVENOUS

## 2019-03-25 MED ORDER — SODIUM CHLORIDE 0.9 % IV SOLN
INTRAVENOUS | Status: DC
  Administered 2019-03-25: 08:00:00 via INTRAVENOUS

## 2019-03-25 MED ORDER — PROPOFOL 10 MG/ML IV BOLUS
INTRAVENOUS | Status: DC | PRN
  Administered 2019-03-25 (×2): 50 mg via INTRAVENOUS

## 2019-03-25 NOTE — CV Procedure (Signed)
Direct current cardioversion:  Indication symptomatic: Symptomatic atrial fibrillation  Procedure: Under deep sedation administered and monitored by anesthesiology, synchronized direct current cardioversion performed. Patient was delivered with 120 Joules of electricity X 1 with success to NSR. Patient tolerated the procedure well. No immediate complication noted.   Julieta Rogalski J Lucita Montoya, MD [facility] [place] Pager: [phone] Office: [phone] If no answer Cell [phone]    

## 2019-03-25 NOTE — Transfer of Care (Signed)
Immediate Anesthesia Transfer of Care Note  Patient: Sharon Acosta  Procedure(s) Performed: CARDIOVERSION (N/A )  Patient Location: PACU and Endoscopy Unit  Anesthesia Type:General  Level of Consciousness: sedated and patient cooperative  Airway & Oxygen Therapy: Patient Spontanous Breathing and Patient connected to nasal cannula oxygen  Post-op Assessment: Report given to RN and Post -op Vital signs reviewed and stable  Post vital signs: Reviewed  Last Vitals:  Vitals Value Taken Time  BP 114/73 03/25/19 0858  Temp 36.7 C 03/25/19 0858  Pulse 63 03/25/19 0858  Resp 20 03/25/19 0858  SpO2 99 % 03/25/19 0858    Last Pain:  Vitals:   03/25/19 0858  TempSrc: Oral  PainSc: 0-No pain         Complications: No apparent anesthesia complications

## 2019-03-25 NOTE — Anesthesia Postprocedure Evaluation (Signed)
Anesthesia Post Note  Patient: Sharon Acosta  Procedure(s) Performed: CARDIOVERSION (N/A )     Patient location during evaluation: Endoscopy Anesthesia Type: General Level of consciousness: awake and alert Pain management: pain level controlled Vital Signs Assessment: post-procedure vital signs reviewed and stable Respiratory status: spontaneous breathing, nonlabored ventilation and respiratory function stable Cardiovascular status: blood pressure returned to baseline and stable Postop Assessment: no apparent nausea or vomiting Anesthetic complications: no    Last Vitals:  Vitals:   03/25/19 0858 03/25/19 0907  BP: 114/73 122/83  Pulse: 63 65  Resp: 20 17  Temp: 36.7 C   SpO2: 99% 96%    Last Pain:  Vitals:   03/25/19 0907  TempSrc:   PainSc: 0-No pain                 Shanequa Whitenight,W. EDMOND

## 2019-03-25 NOTE — Interval H&P Note (Signed)
History and Physical Interval Note:  03/25/2019 8:28 AM  Sharon Acosta  has presented today for surgery, with the diagnosis of a-fib.  The various methods of treatment have been discussed with the patient and family. After consideration of risks, benefits and other options for treatment, the patient has consented to  Procedure(s): CARDIOVERSION (N/A) as a surgical intervention.  The patient's history has been reviewed, patient examined, no change in status, stable for surgery.  I have reviewed the patient's chart and labs.  Questions were answered to the patient's satisfaction.     [place]

## 2019-03-26 ENCOUNTER — Encounter (HOSPITAL_COMMUNITY): Payer: Self-pay | Admitting: Cardiology

## 2019-03-27 ENCOUNTER — Other Ambulatory Visit: Payer: Self-pay

## 2019-03-27 ENCOUNTER — Ambulatory Visit: Payer: BC Managed Care – PPO | Admitting: Cardiology

## 2019-03-27 ENCOUNTER — Encounter: Payer: Self-pay | Admitting: Cardiology

## 2019-03-27 VITALS — BP 109/80 | HR 67 | Ht 67.0 in | Wt 225.6 lb

## 2019-03-27 DIAGNOSIS — I4819 Other persistent atrial fibrillation: Secondary | ICD-10-CM

## 2019-03-27 MED ORDER — FUROSEMIDE 40 MG PO TABS: 40.0000 mg | ORAL_TABLET | Freq: Every day | ORAL | 0 refills | Status: DC

## 2019-03-27 NOTE — Progress Notes (Signed)
Subjective:   Sharon Acosta, female    DOB: 1956/04/20, 63 y.o.   MRN: [id number]  Chief Complaint  Patient presents with  . Atrial Fibrillation    Cardioversion  . Follow-up    Atrial Fibrillation Symptoms include shortness of breath. Symptoms are negative for chest pain, dizziness, palpitations, syncope and weakness. Past medical history includes atrial fibrillation.     63 y.o. Caucasian female, with obesity former smoker, hypertension, pulmonary hypertension secondary to likely COPD, with atrial fibrillation s/p successful cardioversion on 03/25/19, now presents for follow up.  Patient was hospitalized with worsening shortness of breath and leg edema in 01/2019. 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 For several weeks, she had had worsening shortness of breath and leg edema. She denied any chest pain. She underwent nearly 20 L diuresis during hospital admission, including large volume thoracentesis for pleural effusion.  Patient had poor rate control with low-dose metoprolol and diltiazem.  Due to her poor dental hygiene, I felt TEE with cardioversion was fraught with risk of dental trauma.  At this made changes to her rate control regimen, switch metoprolol tartrate to 25 mg 3 times daily and continue diltiazem 360 mg daily.  Continue with her anticoagulation with Eliquis 5 mg twice daily.  With this regimen, she achieved better rate control.  She is doing well post cardioversion, shortness of breath has continued to significantly improve. Denies any leg edema. Tolerating medications well. She has multiple questions regarding pulmonary hypertension and symptoms to watch out for.    Past Medical History:  Diagnosis Date  . Acute on chronic heart failure with preserved ejection fraction ([place]) 01/2019  . Atrial fibrillation ([place]) 01/2019  . Pleural effusion      Past Surgical History:  Procedure Laterality Date  .  CARDIOVERSION N/A 03/25/2019   Procedure: CARDIOVERSION;  Surgeon: Nigel Mormon, MD;  Location: [facility];  Service: Cardiovascular;  Laterality: N/A;  . WISDOM TOOTH EXTRACTION       Social History   Socioeconomic History  . Marital status: Widowed    Spouse name: Not on file  . Number of children: 3  . Years of education: Not on file  . Highest education level: Not on file  Occupational History  . Not on file  Social Needs  . Financial resource strain: Not on file  . Food insecurity    Worry: Not on file    Inability: Not on file  . Transportation needs    Medical: Not on file    Non-medical: Not on file  Tobacco Use  . Smoking status: Former Smoker    Packs/day: 0.50    Years: 40.00    Pack years: 20.00    Types: Cigarettes    Quit date: 02/03/2019    Years since quitting: 0.1  . Smokeless tobacco: Never Used  Substance and Sexual Activity  . Alcohol use: Not Currently  . Drug use: Never  . Sexual activity: Not on file  Lifestyle  . Physical activity    Days per week: Not on file    Minutes per session: Not on file  . Stress: Not on file  Relationships  . Social Herbalist on phone: Not on file    Gets together: Not on file    Attends religious service: Not on file    Active member of club or organization: Not on file    Attends  meetings of clubs or organizations: Not on file    Relationship status: Not on file  . Intimate partner violence    Fear of current or ex partner: Not on file    Emotionally abused: Not on file    Physically abused: Not on file    Forced sexual activity: Not on file  Other Topics Concern  . Not on file  Social History Narrative  . Not on file     Family History  Problem Relation Age of Onset  . Hypertension Father      Current Outpatient Medications on File Prior to Visit  Medication Sig Dispense Refill  . albuterol (PROVENTIL) (2.5 MG/3ML) 0.083% nebulizer solution Take 3 mLs (2.5 mg total) by  nebulization every 6 (six) hours as needed for wheezing or shortness of breath. 75 mL 12  . albuterol (VENTOLIN HFA) 108 (90 Base) MCG/ACT inhaler Inhale 1-2 puffs into the lungs every 4 (four) hours as needed for wheezing or shortness of breath.    Marland Kitchen apixaban (ELIQUIS) 5 MG TABS tablet Take 1 tablet (5 mg total) by mouth 2 (two) times daily. 60 tablet 2  . diltiazem (CARDIZEM CD) 360 MG 24 hr capsule Take 1 capsule (360 mg total) by mouth daily. 30 capsule 2  . furosemide (LASIX) 40 MG tablet Take 40 mg by mouth 2 (two) times daily.    . hydrOXYzine (ATARAX/VISTARIL) 10 MG tablet Take 1 tablet (10 mg total) by mouth 3 (three) times daily as needed for anxiety. 14 tablet 0  . Magnesium 250 MG TABS Take 250 mg by mouth daily with breakfast.    . metoprolol tartrate (LOPRESSOR) 25 MG tablet Take 1 tablet (25 mg total) by mouth 3 (three) times daily. 90 tablet 2  . naproxen sodium (ALEVE) 220 MG tablet Take 220 mg by mouth daily as needed (pain).    . Potassium 99 MG TABS Take 99 mg by mouth daily with breakfast.    . pyridOXINE (VITAMIN B-6) 100 MG tablet Take 100 mg by mouth daily with breakfast.     No current facility-administered medications on file prior to visit.     Cardiovascular studies:  EKG 03/27/2019: Normal sinus rhythm at 68bpm, normal axis, no evidence of ischemia.  Echocardiogram 02/13/2019: Mild LVH. LVEF 50-55%. Unable to assess diastlolic function due to Afib. Normal RV function. RVSP 46 mmHg. Moderate RA dilatation. Mild to mod MAC  CTA Chest 02/12/2019: 1. No definite evidence of pulmonary embolus. 2. Dense consolidation of the left lower lobe, scattered ground-glass airspace opacity at the left lung, and small to moderate left-sided pleural effusion. This may reflect asymmetric pulmonary edema or possibly pneumonia. 3. Soft tissue edema along the anterior chest wall, left breast and mid back.  Recent labs: Results for Sharon, Acosta (MRN [id number]) as of  03/19/2019 08:54  Ref. Range 02/24/2019 10:59  Sodium Latest Ref Range: 135 - 145 mmol/L 137  Potassium Latest Ref Range: 3.5 - 5.1 mmol/L 4.1  Chloride Latest Ref Range: 98 - 111 mmol/L 100  CO2 Latest Ref Range: 22 - 32 mmol/L 25  Glucose Latest Ref Range: 70 - 99 mg/dL 126 (H)  BUN Latest Ref Range: 8 - 23 mg/dL 13  Creatinine Latest Ref Range: 0.44 - 1.00 mg/dL 0.83  Calcium Latest Ref Range: 8.9 - 10.3 mg/dL 8.4 (L)  Anion gap Latest Ref Range: 5 - 15  12  GFR, Est Non African American Latest Ref Range: >60 mL/min >60  GFR, Est African American Latest  Ref Range: >60 mL/min >60   Results for DANYELLE, BROOKOVER (MRN [id number]) as of 03/19/2019 08:54  Ref. Range 02/24/2019 10:59  WBC Latest Ref Range: 4.0 - 10.5 K/uL 10.2  RBC Latest Ref Range: 3.87 - 5.11 MIL/uL 5.42 (H)  Hemoglobin Latest Ref Range: 12.0 - 15.0 g/dL 16.4 (H)  HCT Latest Ref Range: 36.0 - 46.0 % 49.5 (H)  MCV Latest Ref Range: 80.0 - 100.0 fL 91.3  MCH Latest Ref Range: 26.0 - 34.0 pg 30.3  MCHC Latest Ref Range: 30.0 - 36.0 g/dL 33.1  RDW Latest Ref Range: 11.5 - 15.5 % 14.6  Platelets Latest Ref Range: 150 - 400 K/uL 429 (H)  nRBC Latest Ref Range: 0.0 - 0.2 % 0.0   Review of Systems  Constitution: Negative for decreased appetite, malaise/fatigue, weight gain and weight loss.  HENT: Negative for congestion.   Eyes: Negative for visual disturbance.  Cardiovascular: Positive for dyspnea on exertion. Negative for chest pain, leg swelling, palpitations and syncope.  Respiratory: Positive for shortness of breath. Negative for cough.   Endocrine: Negative for cold intolerance.  Hematologic/Lymphatic: Does not bruise/bleed easily.  Skin: Negative for itching and rash.  Musculoskeletal: Negative for myalgias.  Gastrointestinal: Negative for abdominal pain, nausea and vomiting.  Genitourinary: Negative for dysuria.  Neurological: Negative for dizziness and weakness.  Psychiatric/Behavioral: The patient is not  nervous/anxious.   All other systems reviewed and are negative.        Vitals:   03/27/19 1051  BP: 109/80  Pulse: 67  SpO2: 96%    Body mass index is 35.33 kg/m. Filed Weights   03/27/19 1051  Weight: 225 lb 9.6 oz (102.3 kg)     Objective:   Physical Exam  Constitutional: She is oriented to person, place, and time. She appears well-developed and well-nourished. No distress.  HENT:  Head: Normocephalic and atraumatic.  Eyes: Pupils are equal, round, and reactive to light. Conjunctivae are normal.  Neck: No JVD present.  Cardiovascular: Normal rate and intact distal pulses. An irregularly irregular rhythm present.  No murmur heard. Pulmonary/Chest: Effort normal and breath sounds normal. She has no wheezes. She has no rales.  Abdominal: Soft. Bowel sounds are normal. There is no rebound.  Musculoskeletal:        General: No edema.  Lymphadenopathy:    She has no cervical adenopathy.  Neurological: She is alert and oriented to person, place, and time. No cranial nerve deficit.  Skin: Skin is warm and dry.  Psychiatric: She has a normal mood and affect.  Nursing note and vitals reviewed.         Assessment & Recommendations:   63 y.o. Caucasian female, with obesity former smoker, hypertension, now with persistent Afib, pulmonary hypertension.   Paroxysmal A fib: Continues to maintain sinus rhythm. Dyspnea on exertion has resolved with conversion to sinus.  CHA2DS2VAsc score. Continue eliquis 5 mg bid.  Continue metoprolol tartarate 25 mg tid, diltiazem 360 mg daily.   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  Continue follow-up with PCP. Etiology for this was discussed. Advised for her to contact us for any worsening shortness of breath, leg edema, or weight gain. Encouraged her to continue with diet modifications and to work towards weight loss that can help with both.   Miquel Dunn, MSN, APRN, FNP-C  [facility]. [place] Office: [phone] Fax: [phone]

## 2019-06-15 ENCOUNTER — Other Ambulatory Visit: Payer: Self-pay

## 2019-06-15 DIAGNOSIS — I4819 Other persistent atrial fibrillation: Secondary | ICD-10-CM

## 2019-06-15 MED ORDER — DILTIAZEM HCL ER COATED BEADS 360 MG PO CP24: 360.0000 mg | ORAL_CAPSULE | Freq: Every day | ORAL | 0 refills | Status: DC

## 2019-06-15 MED ORDER — APIXABAN 5 MG PO TABS: 5.0000 mg | ORAL_TABLET | Freq: Two times a day (BID) | ORAL | 0 refills | Status: DC

## 2019-06-15 MED ORDER — METOPROLOL TARTRATE 25 MG PO TABS: 25.0000 mg | ORAL_TABLET | Freq: Three times a day (TID) | ORAL | 0 refills | Status: DC

## 2019-07-03 ENCOUNTER — Other Ambulatory Visit: Payer: Self-pay

## 2019-07-03 ENCOUNTER — Encounter: Payer: Self-pay | Admitting: Cardiology

## 2019-07-03 ENCOUNTER — Ambulatory Visit (INDEPENDENT_AMBULATORY_CARE_PROVIDER_SITE_OTHER): Payer: BC Managed Care – PPO | Admitting: Cardiology

## 2019-07-03 VITALS — BP 109/72 | HR 86 | Temp 98.3°F | Ht 67.0 in | Wt 231.0 lb

## 2019-07-03 DIAGNOSIS — I4819 Other persistent atrial fibrillation: Secondary | ICD-10-CM

## 2019-07-03 DIAGNOSIS — I272 Pulmonary hypertension, unspecified: Secondary | ICD-10-CM

## 2019-07-03 MED ORDER — APIXABAN 5 MG PO TABS: 5.0000 mg | ORAL_TABLET | Freq: Two times a day (BID) | ORAL | 3 refills | Status: DC

## 2019-07-03 MED ORDER — DILTIAZEM HCL ER COATED BEADS 360 MG PO CP24: 360.0000 mg | ORAL_CAPSULE | Freq: Every day | ORAL | 3 refills | Status: DC

## 2019-07-03 MED ORDER — FUROSEMIDE 40 MG PO TABS: 40.0000 mg | ORAL_TABLET | Freq: Every day | ORAL | 3 refills | Status: DC

## 2019-07-03 MED ORDER — METOPROLOL TARTRATE 50 MG PO TABS: 50.0000 mg | ORAL_TABLET | Freq: Two times a day (BID) | ORAL | 3 refills | Status: DC

## 2019-07-03 NOTE — Progress Notes (Signed)
Follow up visit  Subjective:   Sharon Acosta, female    DOB: 1955/11/26, 63 y.o.   MRN: [id number]  Chief Complaint  Patient presents with  . Atrial Fibrillation  . Follow-up    3 month    HPI  63 y.o. Caucasian female, with obesity former smoker, hypertension, paroxysmal Afib, prior cardioversion, pulmonary hypertension.   Patient has been feeling well.  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  She does not have any chest pain, shortness of breath.  Leg edema is controlled with Lasix.  Past Medical History:  Diagnosis Date  . Acute on chronic heart failure with preserved ejection fraction ([place]) 01/2019  . Atrial fibrillation ([place]) 01/2019  . Pleural effusion      Past Surgical History:  Procedure Laterality Date  . CARDIOVERSION N/A 03/25/2019   Procedure: CARDIOVERSION;  Surgeon: Nigel Mormon, MD;  Location: [facility];  Service: Cardiovascular;  Laterality: N/A;  . WISDOM TOOTH EXTRACTION       Social History   Socioeconomic History  . Marital status: Widowed    Spouse name: Not on file  . Number of children: 3  . Years of education: Not on file  . Highest education level: Not on file  Occupational History  . Not on file  Tobacco Use  . Smoking status: Former Smoker    Packs/day: 0.50    Years: 40.00    Pack years: 20.00    Types: Cigarettes    Quit date: 02/03/2019    Years since quitting: 0.4  . Smokeless tobacco: Never Used  Substance and Sexual Activity  . Alcohol use: Not Currently  . Drug use: Never  . Sexual activity: Not on file  Other Topics Concern  . Not on file  Social History Narrative  . Not on file   Social Determinants of Health   Financial Resource Strain:   . Difficulty of Paying Living Expenses: Not on file  Food Insecurity:   . Worried About Charity fundraiser in the Last Year: Not on file  . Ran Out of Food in the Last Year: Not on file  Transportation Needs:   . Lack of Transportation (Medical): Not on  file  . Lack of Transportation (Non-Medical): Not on file  Physical Activity:   . Days of Exercise per Week: Not on file  . Minutes of Exercise per Session: Not on file  Stress:   . Feeling of Stress : Not on file  Social Connections:   . Frequency of Communication with Friends and Family: Not on file  . Frequency of Social Gatherings with Friends and Family: Not on file  . Attends Religious Services: Not on file  . Active Member of Clubs or Organizations: Not on file  . Attends Archivist Meetings: Not on file  . Marital Status: Not on file  Intimate Partner Violence:   . Fear of Current or Ex-Partner: Not on file  . Emotionally Abused: Not on file  . Physically Abused: Not on file  . Sexually Abused: Not on file     Family History  Problem Relation Age of Onset  . Hypertension Father      Current Outpatient Medications on File Prior to Visit  Medication Sig Dispense Refill  . albuterol (PROVENTIL) (2.5 MG/3ML) 0.083% nebulizer solution Take 3 mLs (2.5 mg total) by nebulization every 6 (six) hours as needed for wheezing or shortness of breath. 75 mL 12  . albuterol (VENTOLIN HFA) 108 (90  Base) MCG/ACT inhaler Inhale 1-2 puffs into the lungs every 4 (four) hours as needed for wheezing or shortness of breath.    Marland Kitchen apixaban (ELIQUIS) 5 MG TABS tablet Take 1 tablet (5 mg total) by mouth 2 (two) times daily. 180 tablet 0  . diltiazem (CARDIZEM CD) 360 MG 24 hr capsule Take 1 capsule (360 mg total) by mouth daily. 90 capsule 0  . furosemide (LASIX) 40 MG tablet Take 1 tablet (40 mg total) by mouth daily. 90 tablet 0  . hydrOXYzine (ATARAX/VISTARIL) 10 MG tablet Take 1 tablet (10 mg total) by mouth 3 (three) times daily as needed for anxiety. 14 tablet 0  . Magnesium 250 MG TABS Take 250 mg by mouth daily with breakfast.    . metoprolol tartrate (LOPRESSOR) 25 MG tablet Take 1 tablet (25 mg total) by mouth 3 (three) times daily. 90 tablet 0  . naproxen sodium (ALEVE) 220 MG  tablet Take 220 mg by mouth daily as needed (pain).    . Potassium 99 MG TABS Take 99 mg by mouth daily with breakfast.    . pyridOXINE (VITAMIN B-6) 100 MG tablet Take 100 mg by mouth daily with breakfast.     No current facility-administered medications on file prior to visit.    Cardiovascular studies:  EKG 07/03/2019: Atrial fibrillation 100 bpm Nonspecific T-abnormality.   Echocardiogram 02/13/2019: Mild LVH. LVEF 50-55%. Unable to assess diastlolic function due to Afib. Normal RV function. RVSP 46 mmHg. Moderate RA dilatation. Mild to mod MAC  CTA Chest 02/12/2019: 1. No definite evidence of pulmonary embolus. 2. Dense consolidation of the left lower lobe, scattered ground-glass airspace opacity at the left lung, and small to moderate left-sided pleural effusion. This may reflect asymmetric pulmonary edema or possibly pneumonia. 3. Soft tissue edema along the anterior chest wall, left breast and mid back.  Recent labs: Results for ELLIETT, GUARISCO (MRN [id number]) as of 03/19/2019 08:54  Ref. Range 02/24/2019 10:59  Sodium Latest Ref Range: 135 - 145 mmol/L 137  Potassium Latest Ref Range: 3.5 - 5.1 mmol/L 4.1  Chloride Latest Ref Range: 98 - 111 mmol/L 100  CO2 Latest Ref Range: 22 - 32 mmol/L 25  Glucose Latest Ref Range: 70 - 99 mg/dL 126 (H)  BUN Latest Ref Range: 8 - 23 mg/dL 13  Creatinine Latest Ref Range: 0.44 - 1.00 mg/dL 0.83  Calcium Latest Ref Range: 8.9 - 10.3 mg/dL 8.4 (L)  Anion gap Latest Ref Range: 5 - 15  12  GFR, Est Non African American Latest Ref Range: >60 mL/min >60  GFR, Est African American Latest Ref Range: >60 mL/min >60   Results for ANIKAH, HOGGE (MRN [id number]) as of 03/19/2019 08:54  Ref. Range 02/24/2019 10:59  WBC Latest Ref Range: 4.0 - 10.5 K/uL 10.2  RBC Latest Ref Range: 3.87 - 5.11 MIL/uL 5.42 (H)  Hemoglobin Latest Ref Range: 12.0 - 15.0 g/dL 16.4 (H)  HCT Latest Ref Range: 36.0 - 46.0 % 49.5 (H)  MCV Latest Ref Range: 80.0 - 100.0  fL 91.3  MCH Latest Ref Range: 26.0 - 34.0 pg 30.3  MCHC Latest Ref Range: 30.0 - 36.0 g/dL 33.1  RDW Latest Ref Range: 11.5 - 15.5 % 14.6  Platelets Latest Ref Range: 150 - 400 K/uL 429 (H)  nRBC Latest Ref Range: 0.0 - 0.2 % 0.0   Review of Systems  Constitution: Negative for decreased appetite, malaise/fatigue, weight gain and weight loss.  HENT: Negative for congestion.   Eyes: Negative for  visual disturbance.  Cardiovascular: Negative for chest pain, dyspnea on exertion, leg swelling, palpitations and syncope.  Respiratory: Negative for cough and shortness of breath.   Endocrine: Negative for cold intolerance.  Hematologic/Lymphatic: Does not bruise/bleed easily.  Skin: Negative for itching and rash.  Musculoskeletal: Negative for myalgias.  Gastrointestinal: Negative for abdominal pain, nausea and vomiting.  Genitourinary: Negative for dysuria.  Neurological: Negative for dizziness and weakness.  Psychiatric/Behavioral: The patient is not nervous/anxious.   All other systems reviewed and are negative.        Vitals:   07/03/19 0958 07/03/19 1007  BP: (!) 123/91 109/72  Pulse: 86   Temp: 98.3 F (36.8 C)   SpO2: 98%     Body mass index is 36.18 kg/m. Filed Weights   07/03/19 0958  Weight: 231 lb (104.8 kg)     Objective:   Physical Exam  Constitutional: She is oriented to person, place, and time. She appears well-developed and well-nourished. No distress.  HENT:  Head: Normocephalic and atraumatic.  Eyes: Pupils are equal, round, and reactive to light. Conjunctivae are normal.  Neck: No JVD present.  Cardiovascular: Normal rate and intact distal pulses. An irregularly irregular rhythm present.  No murmur heard. Pulmonary/Chest: Effort normal and breath sounds normal. She has no wheezes. She has no rales.  Abdominal: Soft. Bowel sounds are normal. There is no rebound.  Musculoskeletal:        General: No edema.  Lymphadenopathy:    She has no cervical  adenopathy.  Neurological: She is alert and oriented to person, place, and time. No cranial nerve deficit.  Skin: Skin is warm and dry.  Psychiatric: She has a normal mood and affect.  Nursing note and vitals reviewed.         Assessment & Recommendations:   64 y.o. Caucasian female, with obesity former smoker, hypertension, paroxysmal Afib, prior cardioversion, pulmonary hypertension.   Paroxysmal Afib:  CHA2DS2VAsc score. Continue eliquis 5 mg bid.  When she successfully cardioverted to normal rhythm in summer 2020, she is now back in atrial fibrillation.  However, she remains completely asymptomatic.  I discussed rate versus rhythm control therapy option with the patient.  She would like to continue rate control at this time.   Switch metoprolol tartrate 25 mg twice daily to 50 mg twice daily for ease of administration.   Continue diltiazem 360 mg daily.  Pulmonary hypertension: Mild.  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  Referred for sleep study.  Follow-up in 6 months after echocardiogram.  Nigel Mormon, MD [facility] [place] Pager: [phone] Office: [phone] If no answer Cell [phone]

## 2019-08-03 ENCOUNTER — Encounter: Payer: Self-pay | Admitting: Neurology

## 2019-08-03 ENCOUNTER — Ambulatory Visit (INDEPENDENT_AMBULATORY_CARE_PROVIDER_SITE_OTHER): Payer: BC Managed Care – PPO | Admitting: Neurology

## 2019-08-03 ENCOUNTER — Other Ambulatory Visit: Payer: Self-pay

## 2019-08-03 VITALS — BP 114/109 | HR 107 | Temp 97.4°F | Ht 67.0 in | Wt 237.0 lb

## 2019-08-03 DIAGNOSIS — J90 Pleural effusion, not elsewhere classified: Secondary | ICD-10-CM

## 2019-08-03 DIAGNOSIS — I482 Chronic atrial fibrillation, unspecified: Secondary | ICD-10-CM

## 2019-08-03 DIAGNOSIS — I4819 Other persistent atrial fibrillation: Secondary | ICD-10-CM

## 2019-08-03 DIAGNOSIS — I272 Pulmonary hypertension, unspecified: Secondary | ICD-10-CM

## 2019-08-03 DIAGNOSIS — I5032 Chronic diastolic (congestive) heart failure: Secondary | ICD-10-CM

## 2019-08-03 DIAGNOSIS — M25472 Effusion, left ankle: Secondary | ICD-10-CM

## 2019-08-03 DIAGNOSIS — M25471 Effusion, right ankle: Secondary | ICD-10-CM
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
## 2019-08-03 NOTE — Patient Instructions (Signed)

## 2019-08-20 ENCOUNTER — Other Ambulatory Visit: Payer: Self-pay

## 2019-08-20 ENCOUNTER — Ambulatory Visit (INDEPENDENT_AMBULATORY_CARE_PROVIDER_SITE_OTHER): Payer: BC Managed Care – PPO | Admitting: Neurology

## 2019-08-20 DIAGNOSIS — M25471 Effusion, right ankle: Secondary | ICD-10-CM

## 2019-08-20 DIAGNOSIS — G4733 Obstructive sleep apnea (adult) (pediatric): Secondary | ICD-10-CM

## 2019-08-20 DIAGNOSIS — I272 Pulmonary hypertension, unspecified: Secondary | ICD-10-CM

## 2019-08-20 DIAGNOSIS — I4819 Other persistent atrial fibrillation: Secondary | ICD-10-CM

## 2019-08-20 DIAGNOSIS — I482 Chronic atrial fibrillation, unspecified: Secondary | ICD-10-CM

## 2019-08-20 DIAGNOSIS — I5032 Chronic diastolic (congestive) heart failure: Secondary | ICD-10-CM

## 2019-08-20 DIAGNOSIS — J90 Pleural effusion, not elsewhere classified: Secondary | ICD-10-CM

## 2019-09-01 NOTE — Progress Notes (Signed)
Audio and video analysis did not show any abnormal or unusual movements, behaviors, phonations or vocalizations.   The patient took 3 bathroom breaks. No Snoring was noted. EKG in atrial fibrillation- very irregular.  Post-study, the patient indicated that sleep was worse than usual. She slept in an adjustable bed with 5 additional pillows for comfort. No oxygen given here.    IMPRESSION:  1. Insignificant degree of Obstructive Sleep Apnea (OSA) and no oxygen desaturation of clinical significance either.  2. No Periodic Limb Movement Disorder (PLMD) 3. No Primary Snoring. 4. EKG was in atrial fibrillation, chronic and the patient had 3 bathroom breaks fragmenting her sleep .    RECOMMENDATIONS: I would recommend to discontinue oxygen at this time, but prefer the patient's pulmonologist to be involved in that decision. Sharon Acosta would not have to follow up with me or the sleep clinic.

## 2019-09-01 NOTE — Procedures (Signed)
PATIENT'S NAME:  Acosta, Sharon DOB:      [birth year]      MR#:    [id number]     DATE OF RECORDING: 08/20/2019 Rudene Christians REFERRING M.D.:  Vernell Leep, MD Study Performed:   Baseline Polysomnogram/ Bed 2.  HISTORY:  Sharon Acosta is a right -handed White female with a medical history of Acute on chronic heart failure with preserved ejection fraction ([place]) (01/2019), Atrial fibrillation ([place]) (01/2019), paracentesis and ankle edema, pulmonary Hypertension, and Pleural effusion. She sleeps in a hospital bed, elevated for orthopnea. Oxygen dependent at home on 2 liters.   The patient endorsed the Epworth Sleepiness Scale at 11 points.   The patient's weight 237 pounds with a height of 67 (inches), resulting in a BMI of 37.4 kg/m2. The patient's neck circumference measured 15 inches.  CURRENT MEDICATIONS: Proventil, Ventolin, Eliquis, Cardizem, Lasix, Vistaril, Magnesium, Lopressor, Potassium, Vitamin B6. PATIENT ON OXYGEN at home.   PROCEDURE:  This is a multichannel digital polysomnogram utilizing the Somnostar 11.2 system.  Electrodes and sensors were applied and monitored per AASM Specifications.   EEG, EOG, Chin and Limb EMG, were sampled at 200 Hz.  ECG, Snore and Nasal Pressure, Thermal Airflow, Respiratory Effort, CPAP Flow and Pressure, Oximetry was sampled at 50 Hz. Digital video and audio were recorded.      BASELINE STUDY: Lights Out was at 21:46 and Lights On at 04:44.  Total recording time (TRT) was 418.5 minutes, with a total sleep time (TST) of 314 minutes.   The patient's sleep latency was 32.5 minutes.  REM latency was 221 minutes.  The sleep efficiency was 75. %.     SLEEP ARCHITECTURE: WASO (Wake after sleep onset) was 84 minutes.  There were 18.5 minutes in Stage N1, 181 minutes Stage N2, 56 minutes Stage N3 and 58.5 minutes in Stage REM.  The percentage of Stage N1 was 5.9%, Stage N2 was 57.6%, Stage N3 was 17.8% and Stage R (REM sleep) was 18.6%.   RESPIRATORY ANALYSIS:  There were a  total of 22 respiratory events: 0 apneas and an apnea index (AI) of 0 /hour. There were 22 hypopneas with a hypopnea index of 4.2 /hour. The patient also had 0 respiratory event related arousals (RERAs).    The total APNEA/HYPOPNEA INDEX (AHI) was 4.2/hour and the total RESPIRATORY DISTURBANCE INDEX was  4.2 /hour.  20 events occurred in REM sleep and 4 events in NREM. The REM AHI was  20.5 /hour, versus a non-REM AHI of .5. The patient spent 298 minutes of total sleep time in the supine position and 16 minutes in non-supine. The supine AHI was 4.4 versus a non-supine AHI of 0.0/h.Marland Kitchen  OXYGEN SATURATION & C02:  The Wake baseline 02 saturation was 94%, with the lowest being 83%. Time spent below 89% saturation equaled 2 minutes- this was a study without oxygen being supplied.   The arousals were noted as: 43 were spontaneous, 0 were associated with PLMs, 8 were associated with respiratory events. The Periodic Limb Movement (PLM) index was 0 and the PLM Arousal index was 0/hour.   Audio and video analysis did not show any abnormal or unusual movements, behaviors, phonations or vocalizations.   The patient took 3 bathroom breaks. No Snoring was noted.  EKG in atrial fibrillation- very irregular.   Post-study, the patient indicated that sleep was worse than usual. She slept in an adjustable bed with 5 additional pillows for comfort. No oxygen given here.    IMPRESSION:  1. Insignificant  degree of Obstructive Sleep Apnea (OSA) and no oxygen desaturation of clinical significance either.  2. No Periodic Limb Movement Disorder (PLMD) 3. No Primary Snoring. 4. EKG was in atrial fibrillation, chronic and the patient had 3 bathroom breaks fragmenting her sleep .    RECOMMENDATIONS: I would recommend to discontinue oxygen at this time, but prefer the patient's pulmonologist to be involved in that decision.     I certify that I have reviewed the entire raw data recording prior to the issuance of this  report in accordance with the Standards of Accreditation of the American Academy of Sleep Medicine (AASM)      Melvyn Novas, MD Diplomat, American Board of Psychiatry and Neurology  Diplomat, American Board of Sleep Medicine Wellsite geologist, [place] [facility] [employer]

## 2019-09-02 ENCOUNTER — Telehealth: Payer: Self-pay | Admitting: Neurology

## 2019-09-02 NOTE — Telephone Encounter (Signed)
Called the patient and reviewed the sleep study in detailed with her. Advised there was no significant sleep apnea present that would require treatement. There was no concerns of low oxygen, restlessness in extremities. Advised that A Fib was present on the study EKG. Patient verbalized understanding. Asked the patient if she was on oxygen at home and she states that she is not. I confirmed once more before ending the call and she is not on any oxygen at home. She asked if this result would be sent to cardiologist and I advised I would and informed I would make sure it was noted that she is not on oxygen. Patient was appreciative and advised to call if she thinks of any other questions.

## 2019-09-02 NOTE — Telephone Encounter (Signed)
-----   Message from Melvyn Novas, MD sent at 09/01/2019 12:56 PM EST ----- Audio and video analysis did not show any abnormal or unusual movements, behaviors, phonations or vocalizations.   The patient took 3 bathroom breaks. No Snoring was noted. EKG in atrial fibrillation- very irregular.  Post-study, the patient indicated that sleep was worse than usual. She slept in an adjustable bed with 5 additional pillows for comfort. No oxygen given here.    IMPRESSION:  1. Insignificant degree of Obstructive Sleep Apnea (OSA) and no oxygen desaturation of clinical significance either.  2. No Periodic Limb Movement Disorder (PLMD) 3. No Primary Snoring. 4. EKG was in atrial fibrillation, chronic and the patient had 3 bathroom breaks fragmenting her sleep .    RECOMMENDATIONS: I would recommend to discontinue oxygen at this time, but prefer the patient's pulmonologist to be involved in that decision. Sharon Acosta would not have to follow up with me or the sleep clinic.

## 2019-09-16 NOTE — Progress Notes (Signed)
The patient advised [place] the at she is not on oxygen and I will note ann addendum to that effect. CD

## 2019-12-22 ENCOUNTER — Other Ambulatory Visit: Payer: Medicare Other

## 2019-12-24 ENCOUNTER — Other Ambulatory Visit: Payer: BC Managed Care – PPO

## 2020-01-01 ENCOUNTER — Ambulatory Visit: Payer: BC Managed Care – PPO | Admitting: Cardiology

## 2020-03-15 ENCOUNTER — Other Ambulatory Visit: Payer: Self-pay | Admitting: Cardiology

## 2020-03-15 DIAGNOSIS — I272 Pulmonary hypertension, unspecified: Secondary | ICD-10-CM

## 2020-03-15 DIAGNOSIS — I4819 Other persistent atrial fibrillation: Secondary | ICD-10-CM

## 2020-05-09 ENCOUNTER — Other Ambulatory Visit: Payer: Self-pay

## 2020-05-09 DIAGNOSIS — I4819 Other persistent atrial fibrillation: Secondary | ICD-10-CM

## 2020-05-09 DIAGNOSIS — I272 Pulmonary hypertension, unspecified: Secondary | ICD-10-CM

## 2020-05-09 MED ORDER — METOPROLOL TARTRATE 50 MG PO TABS: 50.0000 mg | ORAL_TABLET | Freq: Two times a day (BID) | ORAL | 0 refills | Status: DC

## 2020-05-23 ENCOUNTER — Encounter: Payer: Self-pay | Admitting: Cardiology

## 2020-05-23 ENCOUNTER — Other Ambulatory Visit: Payer: Self-pay

## 2020-05-23 ENCOUNTER — Ambulatory Visit: Payer: Medicare Other | Admitting: Cardiology

## 2020-05-23 ENCOUNTER — Telehealth: Payer: Self-pay

## 2020-05-23 VITALS — BP 137/103 | HR 91 | Resp 15 | Ht 67.0 in | Wt 264.0 lb

## 2020-05-23 DIAGNOSIS — I4811 Longstanding persistent atrial fibrillation: Secondary | ICD-10-CM

## 2020-05-23 DIAGNOSIS — I5032 Chronic diastolic (congestive) heart failure: Secondary | ICD-10-CM

## 2020-05-23 DIAGNOSIS — I272 Pulmonary hypertension, unspecified: Secondary | ICD-10-CM

## 2020-05-23 DIAGNOSIS — I4819 Other persistent atrial fibrillation: Secondary | ICD-10-CM

## 2020-05-23 MED ORDER — METOPROLOL TARTRATE 50 MG PO TABS: 50.0000 mg | ORAL_TABLET | Freq: Two times a day (BID) | ORAL | 2 refills | Status: DC

## 2020-05-23 NOTE — Progress Notes (Signed)
Follow up visit  Subjective:   Sharon Acosta, female    DOB: August 18, 1955, 64 y.o.   MRN: [id number]   Chief Complaint  Patient presents with  . Hypertension  . Follow-up    6 month    HPI  64 y.o. Caucasian female, with obesity former smoker, hypertension, paroxysmal Afib, prior cardioversion, pulmonary hypertension.   Patient has recently been under a lot of work-related stress and admits to not being compliant with regular exercise and low-salt diet.  Blood pressure elevated on first check today, improved subsequently.  She denies any exertional chest pain or dyspnea, orthopnea, PND, leg edema symptoms.   Current Outpatient Medications on File Prior to Visit  Medication Sig Dispense Refill  . apixaban (ELIQUIS) 5 MG TABS tablet Take 1 tablet (5 mg total) by mouth 2 (two) times daily. 180 tablet 3  . diltiazem (CARDIZEM CD) 360 MG 24 hr capsule Take 1 capsule (360 mg total) by mouth daily. 90 capsule 3  . Magnesium 250 MG TABS Take 250 mg by mouth daily with breakfast.    . metoprolol tartrate (LOPRESSOR) 50 MG tablet Take 1 tablet (50 mg total) by mouth 2 (two) times daily. 60 tablet 0  . Potassium 99 MG TABS Take 99 mg by mouth daily with breakfast.    . pyridOXINE (VITAMIN B-6) 100 MG tablet Take 100 mg by mouth daily with breakfast.     No current facility-administered medications on file prior to visit.    Cardiovascular studies:  EKG 05/23/2020: Atrial fibrillation 94 bpm  Nonspecific ST-T changes  Echocardiogram 02/13/2019: Mild LVH. LVEF 50-55%. Unable to assess diastlolic function due to Afib. Normal RV function. RVSP 46 mmHg. Moderate RA dilatation. Mild to mod MAC  CTA Chest 02/12/2019: 1. No definite evidence of pulmonary embolus. 2. Dense consolidation of the left lower lobe, scattered ground-glass airspace opacity at the left lung, and small to moderate left-sided pleural effusion. This may reflect asymmetric pulmonary edema or possibly pneumonia. 3.  Soft tissue edema along the anterior chest wall, left breast and mid back.  Recent labs: 03/25/2019: Glucose 97, BUN/Cr 22/0.9. EGFR 59. Na/K 142/4.2.  H/H 16/49. MCV 91. Platelets 429 Chol 97, TG 123, HDL 26, LDL 46 TSH 1.6 normal   Review of Systems  Cardiovascular: Negative for chest pain, dyspnea on exertion, leg swelling, palpitations and syncope.         Vitals:   05/23/20 0849 05/23/20 0917  BP: (!) 161/124 (!) 137/103  Pulse: (!) 101 91  Resp:  15  SpO2: 98% 96%     Body mass index is 41.35 kg/m. Filed Weights   05/23/20 0836  Weight: 264 lb (119.7 kg)     Objective:   Physical Exam Vitals and nursing note reviewed.  Constitutional:      General: She is not in acute distress. Neck:     Vascular: No JVD.  Cardiovascular:     Rate and Rhythm: Normal rate and regular rhythm.     Heart sounds: Normal heart sounds. No murmur heard.   Pulmonary:     Effort: Pulmonary effort is normal.     Breath sounds: Normal breath sounds. No wheezing or rales.           Assessment & Recommendations:   64 y.o. Caucasian female, with obesity former smoker, hypertension, paroxysmal Afib, prior cardioversion, pulmonary hypertension.   Persistent Afib:  CHA2DS2VAsc score. Continue eliquis 5 mg bid.  She remains asymptomatic from A. fib standpoint.  Previously, I  have discussed rate versus rhythm control therapy option with the patient.  She would like to continue rate control at this time.   Switch metoprolol tartrate 25 mg twice daily to 50 mg twice daily for ease of administration.   Continue diltiazem 360 mg daily. We will repeat echocardiogram.  Hypertension: Blood pressure elevated today.  Encourage home monitoring and bring a log to her next visit.  Will check BMP.  Pulmonary hypertension: Mild.  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   F/u in 3-4 weeks  Nigel Mormon, MD [facility] [place] Pager:  [phone] Office: [phone] If no answer Cell [phone]

## 2020-05-23 NOTE — Telephone Encounter (Signed)
Patient inquiring about this supplement during today's visit. PuraLean is the name of the dietary supplement manufactured by [employer]. White bottle with green/white label. Please advise.Thanks!

## 2020-05-30 ENCOUNTER — Other Ambulatory Visit (HOSPITAL_COMMUNITY): Payer: Self-pay | Admitting: Cardiology

## 2020-05-31 LAB — BASIC METABOLIC PANEL
BUN/Creatinine Ratio: 19 (ref 12–28)
BUN: 19 mg/dL (ref 8–27)
CO2: 21 mmol/L (ref 20–29)
Calcium: 9.6 mg/dL (ref 8.7–10.3)
Chloride: 105 mmol/L (ref 96–106)
Creatinine, Ser: 1.02 mg/dL — ABNORMAL HIGH (ref 0.57–1.00)
GFR calc Af Amer: 67 mL/min/{1.73_m2} (ref >59)
GFR calc non Af Amer: 58 mL/min/{1.73_m2} — ABNORMAL LOW (ref >59)
Glucose: 89 mg/dL (ref 65–99)
Potassium: 4.4 mmol/L (ref 3.5–5.2)
Sodium: 142 mmol/L (ref 134–144)

## 2020-06-07 ENCOUNTER — Other Ambulatory Visit: Payer: Self-pay

## 2020-06-07 ENCOUNTER — Ambulatory Visit: Payer: Self-pay

## 2020-06-07 DIAGNOSIS — I272 Pulmonary hypertension, unspecified: Secondary | ICD-10-CM

## 2020-06-07 DIAGNOSIS — I4819 Other persistent atrial fibrillation: Secondary | ICD-10-CM

## 2020-06-15 ENCOUNTER — Other Ambulatory Visit: Payer: Self-pay

## 2020-06-15 ENCOUNTER — Ambulatory Visit: Payer: Self-pay | Admitting: Cardiology

## 2020-06-15 ENCOUNTER — Encounter: Payer: Self-pay | Admitting: Cardiology

## 2020-06-15 VITALS — BP 127/106 | HR 93 | Ht 67.0 in | Wt 264.0 lb

## 2020-06-15 DIAGNOSIS — I4811 Longstanding persistent atrial fibrillation: Secondary | ICD-10-CM

## 2020-06-15 DIAGNOSIS — I10 Essential (primary) hypertension: Secondary | ICD-10-CM | POA: Insufficient documentation

## 2020-06-15 MED ORDER — METOPROLOL SUCCINATE ER 50 MG PO TB24: 50.0000 mg | ORAL_TABLET | Freq: Every day | ORAL | 3 refills | Status: DC

## 2020-06-15 NOTE — Progress Notes (Signed)
Follow up visit  Subjective:   Sharon Acosta, female    DOB: 12-04-1955, 64 y.o.   MRN: [id number]   Chief Complaint  Patient presents with  . Atrial Fibrillation    results  . Follow-up    pt hasnt had fill dose of metoprolol in 1 week    HPI  64 y.o. Caucasian female, with obesity former smoker, hypertension, paroxysmal Afib, prior cardioversion, pulmonary hypertension.   Patient denies chest pain, shortness of breath, palpitations, leg edema, orthopnea, PND, TIA/syncope. She has only been taking metoprolol tartarate 25 mg once daily, not twice daily. In spite, heart rate is well controlled. BSP is well controlled, although DBP is elevated. Reviewed recent echocardiogram results with the patient, details below.    Current Outpatient Medications on File Prior to Visit  Medication Sig Dispense Refill  . apixaban (ELIQUIS) 5 MG TABS tablet Take 1 tablet (5 mg total) by mouth 2 (two) times daily. 180 tablet 3  . diltiazem (CARDIZEM CD) 360 MG 24 hr capsule Take 1 capsule (360 mg total) by mouth daily. 90 capsule 3  . Magnesium 250 MG TABS Take 250 mg by mouth daily with breakfast.    . metoprolol tartrate (LOPRESSOR) 50 MG tablet Take 1 tablet (50 mg total) by mouth 2 (two) times daily. 180 tablet 2  . Potassium 99 MG TABS Take 99 mg by mouth daily with breakfast.    . pyridOXINE (VITAMIN B-6) 100 MG tablet Take 100 mg by mouth daily with breakfast.     No current facility-administered medications on file prior to visit.    Cardiovascular studies:  Echocardiogram 06/07/2020:  Left ventricle cavity is normal in size. Normal global wall motion.  Indeterminate diastolic filling pattern. Unable to evaluate diastolic  function due to atrial fibrillation. Normal LV systolic function with  visual EF 55-60%. Calculated EF 52%.  Left atrial cavity is mildly dilated by volume.  Structurally normal mitral valve. Mild (Grade I) mitral regurgitation.  Structurally normal tricuspid  valve. Mild tricuspid regurgitation. No  evidence of pulmonary hypertension.  EKG 05/23/2020: Atrial fibrillation 94 bpm  Nonspecific ST-T changes   CTA Chest 02/12/2019: 1. No definite evidence of pulmonary embolus. 2. Dense consolidation of the left lower lobe, scattered ground-glass airspace opacity at the left lung, and small to moderate left-sided pleural effusion. This may reflect asymmetric pulmonary edema or possibly pneumonia. 3. Soft tissue edema along the anterior chest wall, left breast and mid back.  Recent labs: 03/25/2019: Glucose 97, BUN/Cr 22/0.9. EGFR 59. Na/K 142/4.2.  H/H 16/49. MCV 91. Platelets 429 Chol 97, TG 123, HDL 26, LDL 46 TSH 1.6 normal   Review of Systems  Cardiovascular: Negative for chest pain, dyspnea on exertion, leg swelling, palpitations and syncope.         Vitals:   06/15/20 0925  BP: (!) 127/106  Pulse: 93  SpO2: 96%     Body mass index is 41.35 kg/m. Filed Weights   06/15/20 0925  Weight: 264 lb (119.7 kg)     Objective:   Physical Exam Vitals and nursing note reviewed.  Constitutional:      General: She is not in acute distress. Neck:     Vascular: No JVD.  Cardiovascular:     Rate and Rhythm: Normal rate and regular rhythm.     Heart sounds: Normal heart sounds. No murmur heard.   Pulmonary:     Effort: Pulmonary effort is normal.     Breath sounds: Normal breath sounds. No  wheezing or rales.           Assessment & Recommendations:   64 y.o. Caucasian female, with obesity former smoker, hypertension, persistent Afib  Persistent Afib:  CHA2DS2VAsc score. Continue eliquis 5 mg bid.  She remains asymptomatic from A. fib standpoint. EF is preserved. No PH seen on echocardiogram (05/2020)  Previously, I have discussed rate versus rhythm control therapy option with the patient.  She would like to continue rate control at this time.   Switch metoprolol tartrate 25 mg twice daily to succinate 50 mg once  daily for ease of administration.   Continue diltiazem 360 mg daily.  Hypertension: DBP remains elevated. Hopefully, increasing dose of metoprolol succinate may help.   F/u in 6 months  Sharon Acosta Esther Hardy, MD [facility] [place] Pager: [phone] Office: [phone] If no answer Cell [phone]

## 2020-08-24 ENCOUNTER — Other Ambulatory Visit: Payer: Self-pay

## 2020-08-24 DIAGNOSIS — I4819 Other persistent atrial fibrillation: Secondary | ICD-10-CM

## 2020-08-24 MED ORDER — APIXABAN 5 MG PO TABS: 5.0000 mg | ORAL_TABLET | Freq: Two times a day (BID) | ORAL | 3 refills | Status: DC

## 2020-09-12 ENCOUNTER — Other Ambulatory Visit: Payer: Self-pay

## 2020-09-12 DIAGNOSIS — I4819 Other persistent atrial fibrillation: Secondary | ICD-10-CM

## 2020-09-12 MED ORDER — DILTIAZEM HCL ER COATED BEADS 360 MG PO CP24: 360.0000 mg | ORAL_CAPSULE | Freq: Every day | ORAL | 3 refills | Status: DC

## 2020-12-13 NOTE — Progress Notes (Signed)
Follow up visit  Subjective:   Sharon Acosta, female    DOB: Nov 23, 1955, 65 y.o.   MRN: [id number]   Chief Complaint  Patient presents with  . Atrial Fibrillation  . Follow-up    HPI  65 y.o. Caucasian female, with obesity, former smoker, hypertension, persistnet Afib, pulmonary hypertension.   Patient lost her mother just after Christmas 2021. She has had tough time coping with the grief. She states that food has been her comfort through this period. As a result, she has gained some weight. She denies chest pain, shortness of breath, palpitations, leg edema, orthopnea, PND, TIA/syncope.  Current Outpatient Medications on File Prior to Visit  Medication Sig Dispense Refill  . apixaban (ELIQUIS) 5 MG TABS tablet Take 1 tablet (5 mg total) by mouth 2 (two) times daily. 180 tablet 3  . diltiazem (CARDIZEM CD) 360 MG 24 hr capsule Take 1 capsule (360 mg total) by mouth daily. 90 capsule 3  . Magnesium 250 MG TABS Take 250 mg by mouth daily with breakfast.    . metoprolol succinate (TOPROL-XL) 50 MG 24 hr tablet Take 1 tablet (50 mg total) by mouth daily. Take with or immediately following a meal. 90 tablet 3  . Potassium 99 MG TABS Take 99 mg by mouth daily with breakfast.    . pyridOXINE (VITAMIN B-6) 100 MG tablet Take 100 mg by mouth daily with breakfast.     No current facility-administered medications on file prior to visit.    Cardiovascular studies:  EKG 12/14/2020:  Atrial fibrillation 111 bpm Nonspecific ST depression  -Nondiagnostic  Echocardiogram 06/07/2020:  Left ventricle cavity is normal in size. Normal global wall motion.  Indeterminate diastolic filling pattern. Unable to evaluate diastolic  function due to atrial fibrillation. Normal LV systolic function with  visual EF 55-60%. Calculated EF 52%.  Left atrial cavity is mildly dilated by volume.  Structurally normal mitral valve. Mild (Grade I) mitral regurgitation.  Structurally normal tricuspid valve.  Mild tricuspid regurgitation. No  evidence of pulmonary hypertension.   CTA Chest 02/12/2019: 1. No definite evidence of pulmonary embolus. 2. Dense consolidation of the left lower lobe, scattered ground-glass airspace opacity at the left lung, and small to moderate left-sided pleural effusion. This may reflect asymmetric pulmonary edema or possibly pneumonia. 3. Soft tissue edema along the anterior chest wall, left breast and mid back.  Recent labs: 05/30/2020: Glucose 89, BUN/Cr 19/1.02. EGFR 58, Na/K 142/4.4.   03/25/2019: Glucose 97, BUN/Cr 22/0.9. EGFR 59. Na/K 142/4.2.  H/H 16/49. MCV 91. Platelets 429 Chol 97, TG 123, HDL 26, LDL 46 TSH 1.6 normal   Review of Systems  Cardiovascular: Negative for chest pain, dyspnea on exertion, leg swelling, palpitations and syncope.        Vitals:   12/14/20 0814  BP: (!) 130/98  Pulse: 88  Temp: 98 F (36.7 C)  SpO2: 98%     Body mass index is 41.5 kg/m. Filed Weights   12/14/20 0814  Weight: 265 lb (120.2 kg)     Objective:   Physical Exam Vitals and nursing note reviewed.  Constitutional:      General: She is not in acute distress. Neck:     Vascular: No JVD.  Cardiovascular:     Rate and Rhythm: Normal rate and regular rhythm.     Heart sounds: Normal heart sounds. No murmur heard.   Pulmonary:     Effort: Pulmonary effort is normal.     Breath sounds: Normal breath sounds.  No wheezing or rales.           Assessment & Recommendations:   65 y.o. Caucasian female, with obesity former smoker, hypertension, persistent Afib  Persistent Afib:  CHA2DS2VAsc score. Continue eliquis 5 mg bid.  She remains asymptomatic from A. fib standpoint. EF is preserved. No PH seen on echocardiogram (05/2020) Previously, I have discussed rate versus rhythm control therapy option with the patient.  She would like to continue rate control at this time.   Switch metoprolol tartrate 25 mg twice daily to succinate 50 mg  once daily for ease of administration.   Continue diltiazem 360 mg daily. We discussed regular exercise and calorie control in order to lose weight Rate slightly higher today. I will see her back in 4 week after labs to reassesses rate control.   Hypertension: Well controlled  F/u in 6 months  [place], MD [facility] [place] Pager: [phone] Office: [phone] If no answer Cell [phone]

## 2020-12-14 ENCOUNTER — Encounter: Payer: Self-pay | Admitting: Cardiology

## 2020-12-14 ENCOUNTER — Other Ambulatory Visit: Payer: Self-pay

## 2020-12-14 ENCOUNTER — Ambulatory Visit: Payer: Medicare Other | Admitting: Cardiology

## 2020-12-14 VITALS — BP 130/98 | HR 88 | Temp 98.0°F | Ht 67.0 in | Wt 265.0 lb

## 2020-12-14 DIAGNOSIS — I10 Essential (primary) hypertension: Secondary | ICD-10-CM

## 2020-12-14 DIAGNOSIS — I4811 Longstanding persistent atrial fibrillation: Secondary | ICD-10-CM

## 2020-12-20 ENCOUNTER — Inpatient Hospital Stay: Payer: Medicare Other

## 2020-12-20 ENCOUNTER — Other Ambulatory Visit: Payer: Self-pay

## 2020-12-20 DIAGNOSIS — I4811 Longstanding persistent atrial fibrillation: Secondary | ICD-10-CM

## 2020-12-21 LAB — BASIC METABOLIC PANEL
BUN/Creatinine Ratio: 17 (ref 12–28)
BUN: 16 mg/dL (ref 8–27)
CO2: 23 mmol/L (ref 20–29)
Calcium: 8.9 mg/dL (ref 8.7–10.3)
Chloride: 101 mmol/L (ref 96–106)
Creatinine, Ser: 0.94 mg/dL (ref 0.57–1.00)
Glucose: 93 mg/dL (ref 65–99)
Potassium: 4.2 mmol/L (ref 3.5–5.2)
Sodium: 140 mmol/L (ref 134–144)
eGFR: 67 mL/min/{1.73_m2} (ref >59)

## 2020-12-21 LAB — TSH: TSH: 2.18 u[IU]/mL (ref 0.450–4.500)

## 2021-01-18 ENCOUNTER — Encounter: Payer: Self-pay | Admitting: Cardiology

## 2021-01-18 ENCOUNTER — Other Ambulatory Visit: Payer: Self-pay

## 2021-01-18 ENCOUNTER — Ambulatory Visit: Payer: Medicare Other | Admitting: Cardiology

## 2021-01-18 VITALS — BP 125/98 | HR 98 | Temp 97.8°F | Resp 16 | Ht 67.0 in | Wt 266.0 lb

## 2021-01-18 DIAGNOSIS — I4811 Longstanding persistent atrial fibrillation: Secondary | ICD-10-CM

## 2021-01-18 DIAGNOSIS — I10 Essential (primary) hypertension: Secondary | ICD-10-CM

## 2021-01-18 MED ORDER — METOPROLOL SUCCINATE ER 100 MG PO TB24: 100.0000 mg | ORAL_TABLET | Freq: Every day | ORAL | 3 refills | Status: DC

## 2021-01-18 NOTE — Progress Notes (Signed)
Follow up visit  Subjective:   Sharon Acosta, female    DOB: 16-Feb-1956, 65 y.o.   MRN: [id number]   Chief Complaint  Patient presents with   Atrial Fibrillation   Follow-up    HPI  65 y.o. Caucasian female, with obesity, former smoker, hypertension, persistnet Afib, pulmonary hypertension.   Patient denies chest pain, shortness of breath, palpitations, leg edema, orthopnea, PND, TIA/syncope. She is trying to eat better and lose weight. Reviewed monitor results with the patient, details below.   Current Outpatient Medications on File Prior to Visit  Medication Sig Dispense Refill   apixaban (ELIQUIS) 5 MG TABS tablet Take 1 tablet (5 mg total) by mouth 2 (two) times daily. 180 tablet 3   diltiazem (CARDIZEM CD) 360 MG 24 hr capsule Take 1 capsule (360 mg total) by mouth daily. 90 capsule 3   Magnesium 250 MG TABS Take 250 mg by mouth daily with breakfast.     metoprolol succinate (TOPROL-XL) 50 MG 24 hr tablet Take 1 tablet (50 mg total) by mouth daily. Take with or immediately following a meal. 90 tablet 3   NON FORMULARY Take 1 capsule by mouth daily. Pure lean supplement     Potassium 99 MG TABS Take 99 mg by mouth daily with breakfast.     pyridOXINE (VITAMIN B-6) 100 MG tablet Take 100 mg by mouth daily with breakfast.     No current facility-administered medications on file prior to visit.    Cardiovascular studies:  EKG 01/18/2021: Atrial fibrillation 109 bpm Nonspecific ST depression  -Nondiagnostic  Mobile cardiac telemetry 13 days 12/20/2020 - 01/03/2021: Dominant rhythm: Sinus. HR 51-190 bpm. Avg HR 101 bpm. 1 episode of VT, 190 bpm for 4 beats. <1% isolated VE, couplets. 100% Afib burden, rate 51-180 bpm. No atrial flutter/SVT/high grade AV block, sinus pause >3sec noted. 2 patient triggered events.  1 associated with Afib with VR 77-134 bpm, other with Afib with VR 71-103 bpm, occasional ventricular ectopy  Echocardiogram 06/07/2020:  Left ventricle cavity  is normal in size. Normal global wall motion.  Indeterminate diastolic filling pattern. Unable to evaluate diastolic  function due to atrial fibrillation. Normal LV systolic function with  visual EF 55-60%. Calculated EF 52%.  Left atrial cavity is mildly dilated by volume.  Structurally normal mitral valve.  Mild (Grade I) mitral regurgitation.  Structurally normal tricuspid valve.  Mild tricuspid regurgitation. No  evidence of pulmonary hypertension.  CTA Chest 02/12/2019: 1. No definite evidence of pulmonary embolus. 2. Dense consolidation of the left lower lobe, scattered ground-glass airspace opacity at the left lung, and small to moderate left-sided pleural effusion. This may reflect asymmetric pulmonary edema or possibly pneumonia. 3. Soft tissue edema along the anterior chest wall, left breast and mid back.    Recent labs: 12/20/2020: Glucose 93, BUN/Cr 16/0.94. EGFR 67. Na/K 140/4.2.  TSH 2.1 normal  05/30/2020: Glucose 89, BUN/Cr 19/1.02. EGFR 58, Na/K 142/4.4.   03/25/2019: Glucose 97, BUN/Cr 22/0.9. EGFR 59. Na/K 142/4.2.  H/H 16/49. MCV 91. Platelets 429 Chol 97, TG 123, HDL 26, LDL 46 TSH 1.6 normal   Review of Systems  Cardiovascular:  Negative for chest pain, dyspnea on exertion, leg swelling, palpitations and syncope.       Vitals:   01/18/21 1137 01/18/21 1146  BP: (!) 135/91 (!) 125/98  Pulse: (!) 128 98  Resp: 16   Temp: 97.8 F (36.6 C)   SpO2: 96%      Body mass index is 41.66  kg/m. Filed Weights   01/18/21 1137  Weight: 266 lb (120.7 kg)     Objective:   Physical Exam Vitals and nursing note reviewed.  Constitutional:      General: She is not in acute distress. Neck:     Vascular: No JVD.  Cardiovascular:     Rate and Rhythm: Tachycardia present. Rhythm irregular.     Heart sounds: Normal heart sounds. No murmur heard. Pulmonary:     Effort: Pulmonary effort is normal.     Breath sounds: Normal breath sounds. No wheezing or rales.   Musculoskeletal:     Right lower leg: No edema.     Left lower leg: No edema.          Assessment & Recommendations:   65 y.o. Caucasian female, with obesity former smoker, hypertension, persistent Afib  Persistent Afib:  CHA2DS2VAsc score. Continue eliquis 5 mg bid.  She remains asymptomatic from A. fib standpoint. EF is preserved. No PH seen on echocardiogram (05/2020) Previously, I have discussed rate versus rhythm control therapy option with the patient.  She would like to continue rate control at this time.   Afib with avg rate 101 bpm on metoprolol succinate 50 mg daily and diltiazem 360 mg daily. Increase metoprolol succinate to 100 mg daily. We discussed regular exercise and calorie control in order to lose weight  Hypertension: Well controlled   F/u in 3 months  Lavonia Eager Esther Hardy, MD [facility] [place] Pager: [phone] Office: [phone] If no answer Cell [phone]

## 2021-04-18 NOTE — Progress Notes (Addendum)
Follow up visit  Subjective:   Sharon Acosta, female    DOB: [birth year], 65 y.o.   MRN: [id number]   Chief Complaint  Patient presents with   Atrial Fibrillation   Follow-up   Hypertension    HPI  65 y.o. Caucasian female, with obesity, former smoker, hypertension, persistnet Afib, pulmonary hypertension.   Patient is doing well, denies any chest pain, shortness of breath edema, orthopnea, PND symptoms.  She is working with weight loss clinic.  She is started on bupropion.  She stated that her husband was concerned about her heart rate varying from 50-130 while walking.  Separate note, patient has a cyst on right side of her face, and a skin tag on the side of her face.  She is likely going to undergo invasive treatment of both of these lesions with dermatology.   Current Outpatient Medications on File Prior to Visit  Medication Sig Dispense Refill   apixaban (ELIQUIS) 5 MG TABS tablet Take 1 tablet (5 mg total) by mouth 2 (two) times daily. 180 tablet 3   diltiazem (CARDIZEM CD) 360 MG 24 hr capsule Take 1 capsule (360 mg total) by mouth daily. 90 capsule 3   Magnesium 250 MG TABS Take 250 mg by mouth daily with breakfast.     metoprolol succinate (TOPROL-XL) 100 MG 24 hr tablet Take 1 tablet (100 mg total) by mouth daily. Take with or immediately following a meal. 90 tablet 3   NON FORMULARY Take 1 capsule by mouth daily. Pure lean supplement     Potassium 99 MG TABS Take 99 mg by mouth daily with breakfast.     pyridOXINE (VITAMIN B-6) 100 MG tablet Take 100 mg by mouth daily with breakfast.     No current facility-administered medications on file prior to visit.    Cardiovascular studies:  EKG 01/18/2021: Atrial fibrillation 109 bpm Nonspecific ST depression  -Nondiagnostic  Mobile cardiac telemetry 13 days 12/20/2020 - 01/03/2021: Dominant rhythm: Sinus. HR 51-190 bpm. Avg HR 101 bpm. 1 episode of VT, 190 bpm for 4 beats. <1% isolated VE, couplets. 100% Afib burden,  rate 51-180 bpm. No atrial flutter/SVT/high grade AV block, sinus pause >3sec noted. 2 patient triggered events.  1 associated with Afib with VR 77-134 bpm, other with Afib with VR 71-103 bpm, occasional ventricular ectopy  Echocardiogram 06/07/2020:  Left ventricle cavity is normal in size. Normal global wall motion.  Indeterminate diastolic filling pattern. Unable to evaluate diastolic  function due to atrial fibrillation. Normal LV systolic function with  visual EF 55-60%. Calculated EF 52%.  Left atrial cavity is mildly dilated by volume.  Structurally normal mitral valve.  Mild (Grade I) mitral regurgitation.  Structurally normal tricuspid valve.  Mild tricuspid regurgitation. No  evidence of pulmonary hypertension.  CTA Chest 02/12/2019: 1. No definite evidence of pulmonary embolus. 2. Dense consolidation of the left lower lobe, scattered ground-glass airspace opacity at the left lung, and small to moderate left-sided pleural effusion. This may reflect asymmetric pulmonary edema or possibly pneumonia. 3. Soft tissue edema along the anterior chest wall, left breast and mid back.    Recent labs: 12/20/2020: Glucose 93, BUN/Cr 16/0.94. EGFR 67. Na/K 140/4.2.  TSH 2.1 normal  05/30/2020: Glucose 89, BUN/Cr 19/1.02. EGFR 58, Na/K 142/4.4.   03/25/2019: Glucose 97, BUN/Cr 22/0.9. EGFR 59. Na/K 142/4.2.  H/H 16/49. MCV 91. Platelets 429 Chol 97, TG 123, HDL 26, LDL 46 TSH 1.6 normal   Review of Systems  Cardiovascular:  Negative for  chest pain, dyspnea on exertion, leg swelling, palpitations and syncope.       Vitals:   04/19/21 0828  BP: (!) 133/98  Pulse: 83  Temp: 97.6 F (36.4 C)  SpO2: 95%     Body mass index is 42.13 kg/m. Filed Weights   04/19/21 0828  Weight: 269 lb (122 kg)     Objective:   Physical Exam Vitals and nursing note reviewed.  Constitutional:      General: She is not in acute distress. Neck:     Vascular: No JVD.  Cardiovascular:      Rate and Rhythm: Tachycardia present. Rhythm irregular.     Heart sounds: Normal heart sounds. No murmur heard. Pulmonary:     Effort: Pulmonary effort is normal.     Breath sounds: Normal breath sounds. No wheezing or rales.  Musculoskeletal:     Right lower leg: No edema.     Left lower leg: No edema.          Assessment & Recommendations:   65 y.o. Caucasian female, with obesity former smoker, hypertension, persistent Afib  Persistent Afib:  CHA2DS2VAsc score. Continue eliquis 5 mg bid.  She remains asymptomatic from A. fib standpoint. EF is preserved. No PH seen on echocardiogram (05/2020) Previously, I have discussed rate versus rhythm control therapy option with the patient.  She would like to continue rate control at this time.   Afib with avg rate around 90-100 bpm on metoprolol succinate 100 mg daily and diltiazem 360 mg daily. She is now on bupropion which can increase plasma levels of metoprolol.  However, we can monitor her heart rate closely.  She has a follow-up with her PCP in 3 months.  I will see her back in 6 months. I congratulated the patient on her continued weight loss efforts  Given potential for upcoming skin treatments, okay to hold Eliquis for 2 days,  and resume soon after the procedure.  Hypertension: Fairly well controlled   F/u in 6 months  [place], MD [facility] [place] Pager: [phone] Office: [phone] If no answer Cell [phone]

## 2021-04-19 ENCOUNTER — Encounter: Payer: Self-pay | Admitting: Cardiology

## 2021-04-19 ENCOUNTER — Other Ambulatory Visit: Payer: Self-pay

## 2021-04-19 ENCOUNTER — Ambulatory Visit: Payer: Medicare Other | Admitting: Cardiology

## 2021-04-19 VITALS — BP 133/98 | HR 83 | Temp 97.6°F | Ht 67.0 in | Wt 269.0 lb

## 2021-04-19 DIAGNOSIS — I10 Essential (primary) hypertension: Secondary | ICD-10-CM

## 2021-04-19 DIAGNOSIS — I4819 Other persistent atrial fibrillation: Secondary | ICD-10-CM

## 2021-04-19 DIAGNOSIS — I4811 Longstanding persistent atrial fibrillation: Secondary | ICD-10-CM

## 2021-04-19 MED ORDER — METOPROLOL SUCCINATE ER 100 MG PO TB24: 100.0000 mg | ORAL_TABLET | Freq: Every day | ORAL | 3 refills | Status: DC

## 2021-04-19 MED ORDER — DILTIAZEM HCL ER COATED BEADS 360 MG PO CP24: 360.0000 mg | ORAL_CAPSULE | Freq: Every day | ORAL | 3 refills | Status: DC

## 2021-04-19 MED ORDER — APIXABAN 5 MG PO TABS: 5.0000 mg | ORAL_TABLET | Freq: Two times a day (BID) | ORAL | 3 refills | Status: DC

## 2021-04-20 ENCOUNTER — Ambulatory Visit: Payer: Medicare Other | Admitting: Cardiology

## 2021-06-08 IMAGING — DX PORTABLE CHEST - 1 VIEW
1 series · 1 of 1 positions shown · non-contrast
Comparison: None.

CLINICAL DATA: Pt from NP office. Pt has been having pitting edema
in legs and feet X1 week. Pt reports edema in belly, legs and
breast. Pt c/o SOB w/ exertion

EXAM:
PORTABLE CHEST 1 VIEW

[chest]
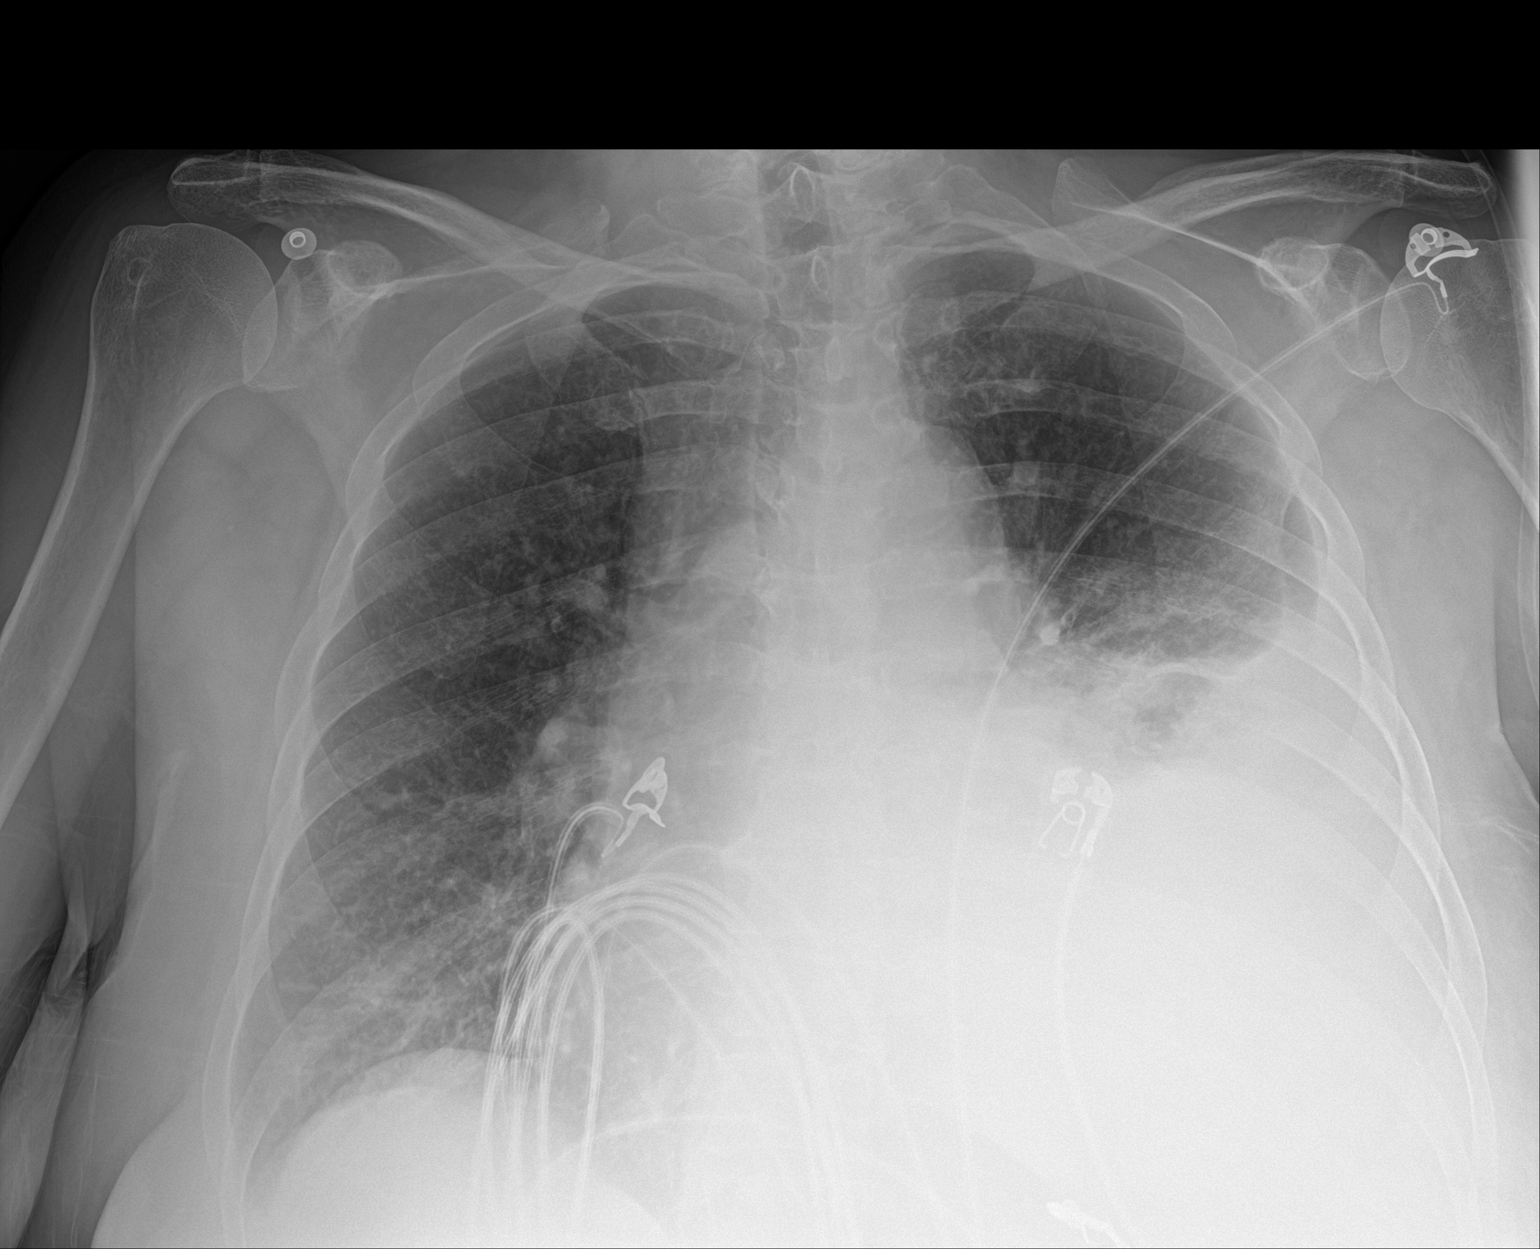

[1 of 1 positions shown; findings below may reference images not displayed]

FINDINGS: Cardiac silhouette partly obscured, likely mildly enlarged. No
mediastinal masses.

Moderate to large left pleural effusion opacifying the left
hemithorax to the level of the left hilum. No right pleural
effusion.

Lungs show prominent bronchovascular markings. There is opacity
adjacent to the left pleural effusion consistent with atelectasis.
No convincing pneumonia and no overt pulmonary edema.

No pneumothorax.

Skeletal structures are grossly intact.
IMPRESSION: 1. Moderate to large left pleural effusion with associated
atelectasis.
2. No convincing pneumonia or pulmonary edema.

## 2021-06-08 IMAGING — CT CT ANGIOGRAPHY CHEST
2 of 5 series · 3 of 16 positions shown · IV contrast (omnipaque)
Comparison: Chest radiograph performed earlier today at [DATE] p.m.

CLINICAL DATA: Acute onset of shortness of breath on exertion and
pitting edema at the lower extremities. Generalized chest pain.

EXAM:
CT ANGIOGRAPHY CHEST WITH CONTRAST
TECHNIQUE: Multidetector CT imaging of the chest was performed using the
standard protocol during bolus administration of intravenous
contrast. Multiplanar CT image reconstructions and MIPs were
obtained to evaluate the vascular anatomy.
CONTRAST:  75mL OMNIPAQUE IOHEXOL 350 MG/ML SOLN

[Series 5: pe 2mm · axial · 0.80mm/px · z∈[+1022,+1290]mm · 2 of 135 slices shown]
[im 1/135  lung]
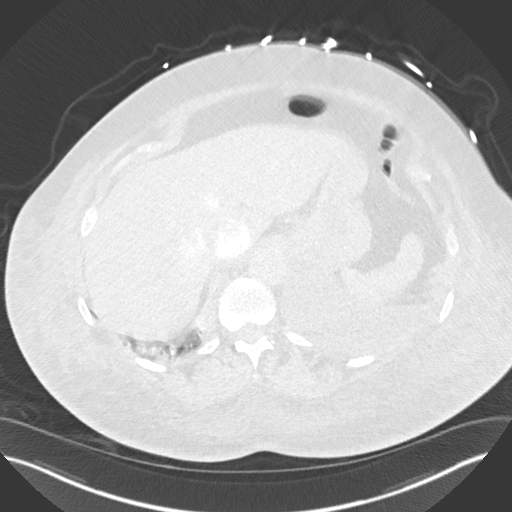
[im 135/135  soft-tissue]
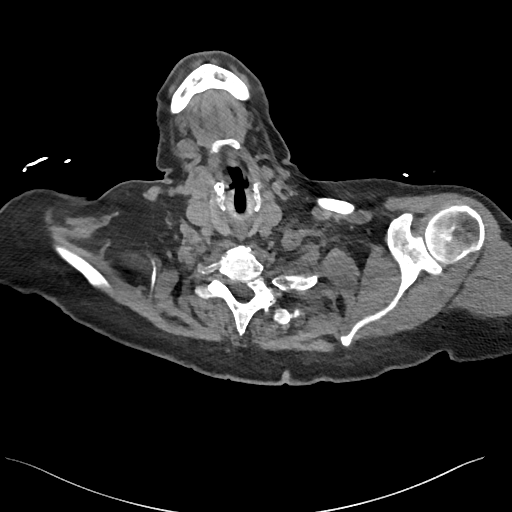

[Series 8: pe 2mm cor · coronal · 0.54mm/px · 1 of 154 slices shown]
[im 77/154  soft-tissue]
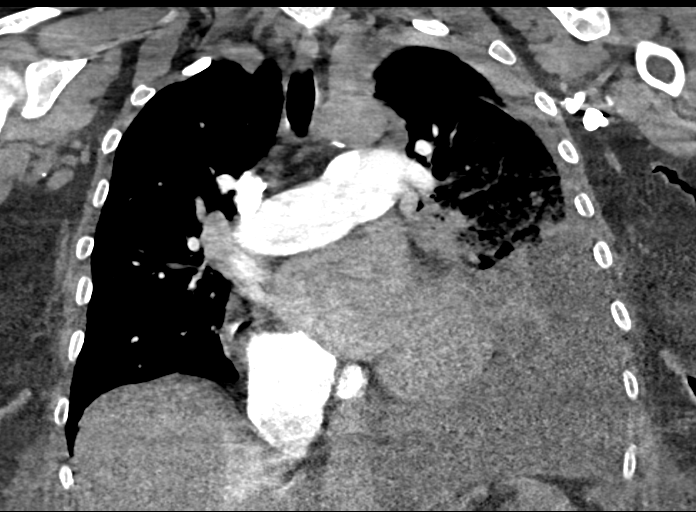

[3 of 16 positions shown; findings below may reference images not displayed]

FINDINGS: Cardiovascular: There is no definite evidence of pulmonary embolus.
Evaluation for pulmonary embolus at the left lower lobe is somewhat
suboptimal due to surrounding airspace opacification.

The heart is borderline prominent. Mild calcification is noted at
the aortic arch.

Mediastinum/Nodes: No definite mediastinal lymphadenopathy is seen.
No pericardial effusion is identified. The visualized portions of
the thyroid gland are unremarkable. No axillary lymphadenopathy is
seen.

Lungs/Pleura: There is a small to moderate left-sided pleural
effusion. There is dense consolidation of the left lower lobe, and
scattered ground-glass airspace opacity within the left lung. This
may reflect asymmetric pulmonary edema or possibly pneumonia. The
left lung appears clear. No pneumothorax is seen.

Upper Abdomen: The visualized portions of the liver are
unremarkable. The spleen appears diminutive.

Musculoskeletal: No acute osseous abnormalities are identified. The
visualized musculature is unremarkable in appearance. Soft tissue
edema is noted along the anterior chest wall, left breast and mid
back.

Review of the MIP images confirms the above findings.
IMPRESSION: 1. No definite evidence of pulmonary embolus.
2. Dense consolidation of the left lower lobe, scattered
ground-glass airspace opacity at the left lung, and small to
moderate left-sided pleural effusion. This may reflect asymmetric
pulmonary edema or possibly pneumonia.
3. Soft tissue edema along the anterior chest wall, left breast and
mid back.

## 2021-10-18 ENCOUNTER — Ambulatory Visit: Payer: Medicare Other | Admitting: Cardiology

## 2022-05-14 ENCOUNTER — Telehealth: Payer: Self-pay | Admitting: Cardiology

## 2022-05-14 ENCOUNTER — Other Ambulatory Visit: Payer: Self-pay

## 2022-05-14 DIAGNOSIS — I4819 Other persistent atrial fibrillation: Secondary | ICD-10-CM

## 2022-05-14 DIAGNOSIS — I4811 Longstanding persistent atrial fibrillation: Secondary | ICD-10-CM

## 2022-05-14 DIAGNOSIS — I10 Essential (primary) hypertension: Secondary | ICD-10-CM

## 2022-05-14 MED ORDER — METOPROLOL SUCCINATE ER 100 MG PO TB24: 100.0000 mg | ORAL_TABLET | Freq: Every day | ORAL | 3 refills | Status: DC

## 2022-05-14 MED ORDER — DILTIAZEM HCL ER COATED BEADS 360 MG PO CP24: 360.0000 mg | ORAL_CAPSULE | Freq: Every day | ORAL | 3 refills | Status: DC

## 2022-05-14 MED ORDER — APIXABAN 5 MG PO TABS: 5.0000 mg | ORAL_TABLET | Freq: Two times a day (BID) | ORAL | 3 refills | Status: DC

## 2022-05-14 NOTE — Telephone Encounter (Signed)
Ok done

## 2022-05-14 NOTE — Telephone Encounter (Signed)
Patient needs refill on eliquis, diltiazem, metoprolol. She has an appt to see MP on 05/16/22.

## 2022-05-16 ENCOUNTER — Ambulatory Visit: Payer: Medicare Other | Admitting: Cardiology

## 2022-05-16 ENCOUNTER — Encounter: Payer: Self-pay | Admitting: Cardiology

## 2022-05-16 VITALS — BP 151/122 | HR 105 | Resp 16 | Ht 67.0 in | Wt 300.0 lb

## 2022-05-16 DIAGNOSIS — I4811 Longstanding persistent atrial fibrillation: Secondary | ICD-10-CM

## 2022-05-16 DIAGNOSIS — I10 Essential (primary) hypertension: Secondary | ICD-10-CM

## 2022-05-16 MED ORDER — METOPROLOL TARTRATE 50 MG PO TABS: 50.0000 mg | ORAL_TABLET | Freq: Three times a day (TID) | ORAL | 1 refills | Status: DC

## 2022-05-16 NOTE — Progress Notes (Signed)
Follow up visit  Subjective:   Sharon Acosta, female    DOB: [birth year], 66 y.o.   MRN: [id number]   Chief Complaint  Patient presents with   Atrial Fibrillation    HPI  66 y.o. Caucasian female, with obesity, former smoker, hypertension, persistnet Afib  Patient had a "bad cold" a few months ago. Since then, her physical activity has been significantly down. This has coincided with wt gain, 30 lbs since a year ago. Her Welbutrin dose was reduced from 600 mg daily (was prescribed as 150 mg bid, but she was taking 300 mg bid), to the intended 300 mg daily. Heart rate is faster today. Blood pressure is elevated. She denies any chest pain, dyspnea symptoms.    Current Outpatient Medications:    apixaban (ELIQUIS) 5 MG TABS tablet, Take 1 tablet (5 mg total) by mouth 2 (two) times daily., Disp: 180 tablet, Rfl: 3   buPROPion (WELLBUTRIN XL) 300 MG 24 hr tablet, Take 300 mg by mouth every morning., Disp: , Rfl:    diltiazem (CARDIZEM CD) 360 MG 24 hr capsule, Take 1 capsule (360 mg total) by mouth daily., Disp: 90 capsule, Rfl: 3   hydrOXYzine (ATARAX/VISTARIL) 25 MG tablet, Take 25 mg by mouth 3 (three) times daily as needed., Disp: , Rfl:    Magnesium 250 MG TABS, Take 250 mg by mouth daily with breakfast., Disp: , Rfl:    MAGNESIUM PO, Take 250 mg by mouth daily at 12 noon., Disp: , Rfl:    metoprolol succinate (TOPROL-XL) 100 MG 24 hr tablet, Take 1 tablet (100 mg total) by mouth daily. Take with or immediately following a meal., Disp: 90 tablet, Rfl: 3   Potassium 99 MG TABS, Take 99 mg by mouth daily with breakfast., Disp: , Rfl:    pyridOXINE (VITAMIN B-6) 100 MG tablet, Take 100 mg by mouth daily with breakfast., Disp: , Rfl:   Cardiovascular studies:  EKG 05/16/2022: Atrial fibrillation with rapid ventricular rate 125 bpm  Nonspecific ST depression -Nondiagnostic.  Mobile cardiac telemetry 13 days 12/20/2020 - 01/03/2021: Dominant rhythm: Sinus. HR 51-190 bpm. Avg HR 101  bpm. 1 episode of VT, 190 bpm for 4 beats. <1% isolated VE, couplets. 100% Afib burden, rate 51-180 bpm. No atrial flutter/SVT/high grade AV block, sinus pause >3sec noted. 2 patient triggered events.  1 associated with Afib with VR 77-134 bpm, other with Afib with VR 71-103 bpm, occasional ventricular ectopy  Echocardiogram 06/07/2020:  Left ventricle cavity is normal in size. Normal global wall motion.  Indeterminate diastolic filling pattern. Unable to evaluate diastolic  function due to atrial fibrillation. Normal LV systolic function with  visual EF 55-60%. Calculated EF 52%.  Left atrial cavity is mildly dilated by volume.  Structurally normal mitral valve.  Mild (Grade I) mitral regurgitation.  Structurally normal tricuspid valve.  Mild tricuspid regurgitation. No  evidence of pulmonary hypertension.  CTA Chest 02/12/2019: 1. No definite evidence of pulmonary embolus. 2. Dense consolidation of the left lower lobe, scattered ground-glass airspace opacity at the left lung, and small to moderate left-sided pleural effusion. This may reflect asymmetric pulmonary edema or possibly pneumonia. 3. Soft tissue edema along the anterior chest wall, left breast and mid back.    Recent labs: 12/20/2020: Glucose 93, BUN/Cr 16/0.94. EGFR 67. Na/K 140/4.2.  TSH 2.1 normal  05/30/2020: Glucose 89, BUN/Cr 19/1.02. EGFR 58, Na/K 142/4.4.   03/25/2019: Glucose 97, BUN/Cr 22/0.9. EGFR 59. Na/K 142/4.2.  H/H 16/49. MCV 91. Platelets 429 Chol  97, TG 123, HDL 26, LDL 46 TSH 1.6 normal   Review of Systems  Cardiovascular:  Negative for chest pain, dyspnea on exertion, leg swelling, palpitations and syncope.        Vitals:   05/16/22 1056 05/16/22 1057  BP: (!) 170/135 (!) 151/122  Pulse: 95 (!) 105  Resp: 16   SpO2: 97%      Body mass index is 46.99 kg/m. Filed Weights   05/16/22 1056  Weight: 300 lb (136.1 kg)     Objective:   Physical Exam Vitals and nursing note  reviewed.  Constitutional:      General: She is not in acute distress. Neck:     Vascular: No JVD.  Cardiovascular:     Rate and Rhythm: Tachycardia present. Rhythm irregular.     Heart sounds: Normal heart sounds. No murmur heard. Pulmonary:     Effort: Pulmonary effort is normal.     Breath sounds: Normal breath sounds. No wheezing or rales.  Musculoskeletal:     Right lower leg: No edema.     Left lower leg: No edema.          Assessment & Recommendations:   66 y.o. Caucasian female, with obesity former smoker, hypertension, persistent Afib  Persistent Afib:  CHA2DS2VAsc score. Continue eliquis 5 mg bid.  She remains asymptomatic from A. fib standpoint. EF is preserved. No PH seen on echocardiogram (05/2020) Previously, I have discussed rate versus rhythm control therapy option with the patient.  She would like to continue rate control at this time-given previous cardioversion did not sustain sinus rhythm. Ventricular rate in 120s today. Change metoprolol succinate 100 mg daily to metoprolol tartrate 50 mg tid. Hope this will help with blood pressure as well, to an extent. F/u in 2-3 weeks. If ventricular rate remains >120 bpm, will increase to metoprolol tartrate 100 mg bid.  If ventricular rate still remains uncontrolled, could consider cardioversion. Discussed diet and lifestyle modification. Weight loss remains important part of Afib management. She has stated that she cannot afford weight loss clinic as she is on Medicare.   Hypertension: As above.    F/u in 2-3 weeks w/Brittany Nancie Neas, NP    Nigel Mormon, MD Pager: [phone] Office: [phone]

## 2022-05-29 NOTE — Progress Notes (Unsigned)
Follow up visit  Subjective:   Kilea Mccarey, female    DOB: January 07, 1956, 66 y.o.   MRN: [id number]   No chief complaint on file.   HPI  66 y.o. Caucasian female, with obesity, former smoker, hypertension, persistnet Afib  Patient had a "bad cold" a few months ago. Since then, her physical activity has been significantly down. This has coincided with wt gain, 30 lbs since a year ago. Her Welbutrin dose was reduced from 600 mg daily (was prescribed as 150 mg bid, but she was taking 300 mg bid), to the intended 300 mg daily. Heart rate is faster today. Blood pressure is elevated. She denies any chest pain, dyspnea symptoms.    Current Outpatient Medications:    apixaban (ELIQUIS) 5 MG TABS tablet, Take 1 tablet (5 mg total) by mouth 2 (two) times daily., Disp: 180 tablet, Rfl: 3   buPROPion (WELLBUTRIN XL) 300 MG 24 hr tablet, Take 300 mg by mouth every morning., Disp: , Rfl:    diltiazem (CARDIZEM CD) 360 MG 24 hr capsule, Take 1 capsule (360 mg total) by mouth daily., Disp: 90 capsule, Rfl: 3   hydrOXYzine (ATARAX/VISTARIL) 25 MG tablet, Take 25 mg by mouth 3 (three) times daily as needed., Disp: , Rfl:    Magnesium 250 MG TABS, Take 250 mg by mouth daily with breakfast., Disp: , Rfl:    MAGNESIUM PO, Take 250 mg by mouth daily at 12 noon., Disp: , Rfl:    metoprolol tartrate (LOPRESSOR) 50 MG tablet, Take 1 tablet (50 mg total) by mouth 3 (three) times daily., Disp: 90 tablet, Rfl: 1   Potassium 99 MG TABS, Take 99 mg by mouth daily with breakfast., Disp: , Rfl:    pyridOXINE (VITAMIN B-6) 100 MG tablet, Take 100 mg by mouth daily with breakfast., Disp: , Rfl:   Cardiovascular studies:  EKG 05/16/2022: Atrial fibrillation with rapid ventricular rate 125 bpm  Nonspecific ST depression -Nondiagnostic.  Mobile cardiac telemetry 13 days 12/20/2020 - 01/03/2021: Dominant rhythm: Sinus. HR 51-190 bpm. Avg HR 101 bpm. 1 episode of VT, 190 bpm for 4 beats. <1% isolated VE,  couplets. 100% Afib burden, rate 51-180 bpm. No atrial flutter/SVT/high grade AV block, sinus pause >3sec noted. 2 patient triggered events.  1 associated with Afib with VR 77-134 bpm, other with Afib with VR 71-103 bpm, occasional ventricular ectopy  Echocardiogram 06/07/2020:  Left ventricle cavity is normal in size. Normal global wall motion.  Indeterminate diastolic filling pattern. Unable to evaluate diastolic  function due to atrial fibrillation. Normal LV systolic function with  visual EF 55-60%. Calculated EF 52%.  Left atrial cavity is mildly dilated by volume.  Structurally normal mitral valve.  Mild (Grade I) mitral regurgitation.  Structurally normal tricuspid valve.  Mild tricuspid regurgitation. No  evidence of pulmonary hypertension.  CTA Chest 02/12/2019: 1. No definite evidence of pulmonary embolus. 2. Dense consolidation of the left lower lobe, scattered ground-glass airspace opacity at the left lung, and small to moderate left-sided pleural effusion. This may reflect asymmetric pulmonary edema or possibly pneumonia. 3. Soft tissue edema along the anterior chest wall, left breast and mid back.    Recent labs: 12/20/2020: Glucose 93, BUN/Cr 16/0.94. EGFR 67. Na/K 140/4.2.  TSH 2.1 normal  05/30/2020: Glucose 89, BUN/Cr 19/1.02. EGFR 58, Na/K 142/4.4.   03/25/2019: Glucose 97, BUN/Cr 22/0.9. EGFR 59. Na/K 142/4.2.  H/H 16/49. MCV 91. Platelets 429 Chol 97, TG 123, HDL 26, LDL 46 TSH 1.6 normal  Review of Systems  Cardiovascular:  Negative for chest pain, dyspnea on exertion, leg swelling, palpitations and syncope.    There were no vitals filed for this visit.  There is no height or weight on file to calculate BMI. There were no vitals filed for this visit.  Objective:   Physical Exam Vitals and nursing note reviewed.  Constitutional:      General: She is not in acute distress. Neck:     Vascular: No JVD.  Cardiovascular:     Rate and Rhythm:  Tachycardia present. Rhythm irregular.     Heart sounds: Normal heart sounds. No murmur heard. Pulmonary:     Effort: Pulmonary effort is normal.     Breath sounds: Normal breath sounds. No wheezing or rales.  Musculoskeletal:     Right lower leg: No edema.     Left lower leg: No edema.     Assessment & Recommendations:   66 y.o. Caucasian female, with obesity former smoker, hypertension, persistent Afib  Persistent Afib:  CHA2DS2VAsc score. Continue eliquis 5 mg bid.  She remains asymptomatic from A. fib standpoint. EF is preserved. No PH seen on echocardiogram (05/2020) Previously, I have discussed rate versus rhythm control therapy option with the patient.  She would like to continue rate control at this time-given previous cardioversion did not sustain sinus rhythm. Ventricular rate in 120s today. Change metoprolol succinate 100 mg daily to metoprolol tartrate 50 mg tid. Hope this will help with blood pressure as well, to an extent. F/u in 2-3 weeks. If ventricular rate remains >120 bpm, will increase to metoprolol tartrate 100 mg bid.  If ventricular rate still remains uncontrolled, could consider cardioversion. Discussed diet and lifestyle modification. Weight loss remains important part of Afib management. She has stated that she cannot afford weight loss clinic as she is on Medicare.   Hypertension: As above.    F/u in 2-3 weeks w/Gia Lusher Nancie Neas, NP    Nigel Mormon, MD Pager: [phone] Office: [phone]

## 2022-05-30 ENCOUNTER — Ambulatory Visit: Payer: Medicare Other

## 2022-05-30 VITALS — BP 157/93 | HR 79 | Resp 16 | Ht 67.0 in | Wt 296.0 lb

## 2022-05-30 DIAGNOSIS — I5032 Chronic diastolic (congestive) heart failure: Secondary | ICD-10-CM

## 2022-05-30 DIAGNOSIS — I10 Essential (primary) hypertension: Secondary | ICD-10-CM

## 2022-05-30 DIAGNOSIS — I4811 Longstanding persistent atrial fibrillation: Secondary | ICD-10-CM

## 2022-05-30 MED ORDER — METOPROLOL TARTRATE 100 MG PO TABS: 100.0000 mg | ORAL_TABLET | Freq: Two times a day (BID) | ORAL | 3 refills | Status: DC

## 2022-05-30 MED ORDER — FUROSEMIDE 40 MG PO TABS: 40.0000 mg | ORAL_TABLET | Freq: Every day | ORAL | 2 refills | Status: DC

## 2022-05-30 MED ORDER — METOPROLOL TARTRATE 100 MG PO TABS: 50.0000 mg | ORAL_TABLET | Freq: Two times a day (BID) | ORAL | 3 refills | Status: DC

## 2022-05-30 NOTE — Addendum Note (Signed)
Addended by: Nori Riis on: 05/30/2022 10:45 AM   Modules accepted: Orders

## 2022-06-21 LAB — PRO B NATRIURETIC PEPTIDE: NT-Pro BNP: 1156 pg/mL — ABNORMAL HIGH (ref 0–301)

## 2022-06-21 LAB — BASIC METABOLIC PANEL
BUN/Creatinine Ratio: 14 (ref 12–28)
BUN: 18 mg/dL (ref 8–27)
CO2: 25 mmol/L (ref 20–29)
Calcium: 8.9 mg/dL (ref 8.7–10.3)
Chloride: 99 mmol/L (ref 96–106)
Creatinine, Ser: 1.25 mg/dL — ABNORMAL HIGH (ref 0.57–1.00)
Glucose: 96 mg/dL (ref 70–99)
Potassium: 4.3 mmol/L (ref 3.5–5.2)
Sodium: 144 mmol/L (ref 134–144)
eGFR: 48 mL/min/{1.73_m2} — ABNORMAL LOW (ref >59)

## 2022-06-21 NOTE — Telephone Encounter (Signed)
From patient.

## 2022-06-27 ENCOUNTER — Ambulatory Visit: Payer: Medicare Other | Admitting: Cardiology

## 2022-06-27 ENCOUNTER — Encounter: Payer: Self-pay | Admitting: Cardiology

## 2022-06-27 ENCOUNTER — Ambulatory Visit: Payer: Medicare Other

## 2022-06-27 VITALS — BP 139/90 | HR 87 | Resp 16 | Ht 67.0 in | Wt 291.4 lb

## 2022-06-27 DIAGNOSIS — I5033 Acute on chronic diastolic (congestive) heart failure: Secondary | ICD-10-CM

## 2022-06-27 DIAGNOSIS — I4811 Longstanding persistent atrial fibrillation: Secondary | ICD-10-CM

## 2022-06-27 DIAGNOSIS — I1A0 Resistant hypertension: Secondary | ICD-10-CM

## 2022-06-27 MED ORDER — FUROSEMIDE 40 MG PO TABS: 40.0000 mg | ORAL_TABLET | Freq: Every day | ORAL | 2 refills | Status: DC | PRN

## 2022-06-27 MED ORDER — HYDRALAZINE HCL 50 MG PO TABS: 50.0000 mg | ORAL_TABLET | Freq: Three times a day (TID) | ORAL | 2 refills | Status: DC

## 2022-06-27 MED ORDER — ISOSORBIDE DINITRATE 30 MG PO TABS: 30.0000 mg | ORAL_TABLET | Freq: Three times a day (TID) | ORAL | 2 refills | Status: DC

## 2022-06-27 MED ORDER — LOSARTAN POTASSIUM-HCTZ 50-12.5 MG PO TABS: 1.0000 | ORAL_TABLET | ORAL | 2 refills | Status: DC

## 2022-06-27 NOTE — Progress Notes (Signed)
Follow up visit  Subjective:   Sharon Acosta, female    DOB: 10-31-1955, 66 y.o.   MRN: [id number]   No chief complaint on file.  HPI  Sharon Acosta is a 66 y.o. Caucasian female, with obesity former smoker, hypertension, permanent Afib, history of hypertension, morbid obesity, rule out for sleep apnea, was very noncompliant and was not on any medications until recent hospitalization for heart failure presents here for a 2-week follow-up.  She was seen about 2 weeks ago for worsening dyspnea and leg edema, was placed on furosemide and she now presents for follow-up.  She has noticed marked improvement in dyspnea and also leg edema and overall fluid accumulation in her body since being on furosemide.    Current Outpatient Medications:    apixaban (ELIQUIS) 5 MG TABS tablet, Take 1 tablet (5 mg total) by mouth 2 (two) times daily., Disp: 180 tablet, Rfl: 3   buPROPion (WELLBUTRIN XL) 300 MG 24 hr tablet, Take 300 mg by mouth every morning., Disp: , Rfl:    diltiazem (CARDIZEM CD) 360 MG 24 hr capsule, Take 1 capsule (360 mg total) by mouth daily., Disp: 90 capsule, Rfl: 3   hydrALAZINE (APRESOLINE) 50 MG tablet, Take 1 tablet (50 mg total) by mouth 3 (three) times daily., Disp: 90 tablet, Rfl: 2   hydrOXYzine (ATARAX/VISTARIL) 25 MG tablet, Take 25 mg by mouth 3 (three) times daily as needed., Disp: , Rfl:    isosorbide dinitrate (ISORDIL) 30 MG tablet, Take 1 tablet (30 mg total) by mouth 3 (three) times daily., Disp: 90 tablet, Rfl: 2   losartan-hydrochlorothiazide (HYZAAR) 50-12.5 MG tablet, Take 1 tablet by mouth every morning., Disp: 30 tablet, Rfl: 2   Magnesium 250 MG TABS, Take 250 mg by mouth daily with breakfast., Disp: , Rfl:    metoprolol tartrate (LOPRESSOR) 100 MG tablet, Take 1 tablet (100 mg total) by mouth 2 (two) times daily., Disp: 90 tablet, Rfl: 3   Potassium 99 MG TABS, Take 99 mg by mouth daily as needed. Take with furosemide, Disp: , Rfl:    pyridOXINE (VITAMIN  B-6) 100 MG tablet, Take 100 mg by mouth daily with breakfast., Disp: , Rfl:    furosemide (LASIX) 40 MG tablet, Take 1 tablet (40 mg total) by mouth daily as needed for fluid or edema., Disp: 30 tablet, Rfl: 2  Cardiovascular studies:  EKG 05/30/2022: Atrial fibrillation with controlled ventricular response at 101 bpm.  Nonspecific T wave abnormality.  Compared to previous EKG on 05/16/2022, ventricular rate is controlled.  Mobile cardiac telemetry 13 days 12/20/2020 - 01/03/2021: Dominant rhythm: Sinus. HR 51-190 bpm. Avg HR 101 bpm. 1 episode of VT, 190 bpm for 4 beats. <1% isolated VE, couplets. 100% Afib burden, rate 51-180 bpm. No atrial flutter/SVT/high grade AV block, sinus pause >3sec noted. 2 patient triggered events.  1 associated with Afib with VR 77-134 bpm, other with Afib with VR 71-103 bpm, occasional ventricular ectopy  Echocardiogram 06/07/2020:  Left ventricle cavity is normal in size. Normal global wall motion.  Indeterminate diastolic filling pattern. Unable to evaluate diastolic  function due to atrial fibrillation. Normal LV systolic function with  visual EF 55-60%. Calculated EF 52%.  Left atrial cavity is mildly dilated by volume.  Structurally normal mitral valve.  Mild (Grade I) mitral regurgitation.  Structurally normal tricuspid valve.  Mild tricuspid regurgitation. No  evidence of pulmonary hypertension.  Sleep study 08/20/2019: 1. Insignificant degree of Obstructive Sleep Apnea (OSA) and no  oxygen desaturation  of clinical significance either.     Recent labs:  Lab Results  Component Value Date   NA 144 06/20/2022   K 4.3 06/20/2022   CO2 25 06/20/2022   GLUCOSE 96 06/20/2022   BUN 18 06/20/2022   CREATININE 1.25 (H) 06/20/2022   CALCIUM 8.9 06/20/2022   EGFR 48 (L) 06/20/2022   GFRNONAA 58 (L) 05/30/2020       Latest Ref Rng & Units 06/20/2022    9:13 AM 12/20/2020    8:55 AM 05/30/2020   11:19 AM  BMP  Glucose 70 - 99 mg/dL 96  93  89    BUN 8 - 27 mg/dL _0 Creatinine 0.57 - 1.00 mg/dL 1.25  0.94  1.02   BUN/Creat Ratio 12 - _1 Sodium 134 - 144 mmol/L 144  140  142   Potassium 3.5 - 5.2 mmol/L 4.3  4.2  4.4   Chloride 96 - 106 mmol/L 99  101  105   CO2 20 - 29 mmol/L _2 Calcium 8.7 - 10.3 mg/dL 8.9  8.9  9.6     BNP (last 3 results) No results for input(s): "BNP" in the last 8760 hours.  ProBNP (last 3 results) Recent Labs    06/20/22 0913  PROBNP 1,156*    12/20/2020: Glucose 93, BUN/Cr 16/0.94. EGFR 67. Na/K 140/4.2.  TSH 2.1 normal  05/30/2020: Glucose 89, BUN/Cr 19/1.02. EGFR 58, Na/K 142/4.4.   03/25/2019: Glucose 97, BUN/Cr 22/0.9. EGFR 59. Na/K 142/4.2.  H/H 16/49. MCV 91. Platelets 429 Chol 97, TG 123, HDL 26, LDL 46 TSH 1.6 normal   Review of Systems  Cardiovascular:  Positive for dyspnea on exertion and leg swelling. Negative for chest pain.    Vitals:   06/27/22 0828 06/27/22 0830  BP: (!) 151/101 (!) 139/90  Pulse: 89 87  Resp: 16   SpO2: 95%    Body mass index is 45.64 kg/m. Filed Weights   06/27/22 0828  Weight: 291 lb 6.4 oz (132.2 kg)   Objective:   Physical Exam Constitutional:      Appearance: She is morbidly obese.  Neck:     Vascular: No carotid bruit or JVD.  Cardiovascular:     Rate and Rhythm: Normal rate. Rhythm irregularly irregular.     Pulses: Normal pulses and intact distal pulses.     Heart sounds: No murmur heard. Pulmonary:     Effort: Pulmonary effort is normal.     Breath sounds: Examination of the right-lower field reveals rales. Examination of the left-lower field reveals rales. Rales present.  Abdominal:     General: Bowel sounds are normal.     Palpations: Abdomen is soft.  Musculoskeletal:     Right lower leg: Edema (1-2+ pitting ankle) present.     Left lower leg: Edema (1-2+ pitting ankle) present.  Skin:    Capillary Refill: Capillary refill takes less than 2 seconds.     Assessment & Recommendations:       ICD-10-CM   1. Longstanding persistent atrial fibrillation (HCC)  I48.11 hydrALAZINE (APRESOLINE) 50 MG tablet    isosorbide dinitrate (ISORDIL) 30 MG tablet    losartan-hydrochlorothiazide (HYZAAR) 50-12.5 MG tablet    2. Resistant hypertension  I1A.0 hydrALAZINE (APRESOLINE) 50 MG tablet    isosorbide dinitrate (ISORDIL) 30 MG tablet    losartan-hydrochlorothiazide (HYZAAR) 50-12.5 MG tablet    3. Acute on chronic heart  failure with preserved ejection fraction (HCC)  I50.33 PCV ECHOCARDIOGRAM COMPLETE    furosemide (LASIX) 40 MG tablet    Pro b natriuretic peptide (BNP)    Basic metabolic panel      Sharon Acosta is a 66 y.o. Caucasian female, with obesity former smoker, hypertension, permanent Afib, history of hypertension, morbid obesity, rule out for sleep apnea, was very noncompliant and was not on any medications until recent hospitalization for heart failure presents here for a 2-week follow-up.  1. Longstanding persistent atrial fibrillation [facility]) Patient is in permanent atrial fibrillation as per history.  We could really consider repeating cardioversion however I will leave it to the primary cardiologist Dr. Virgina Jock regarding this.  I will obtain echocardiogram as it has been >2 years since last echocardiogram.  Rate is well-controlled now.  Continue metoprolol and also diltiazem.  2. Resistant hypertension Blood pressure is markedly elevated.  I have extensively discussed with her regarding the diet and salt restriction and fluid restriction as well.  I will add hydralazine 50 mg 3 times daily along with isosorbide dinitrate 30 mg 3 times daily for hypertension control.  3. Acute on chronic heart failure with preserved ejection fraction (HCC) Patient is presently in acute decompensated heart failure with faint bibasilar crackles, still persistent dyspnea, leg edema is only trace.  However symptoms are significantly proved since last office visit since being on  furosemide.  I will change furosemide to as needed use, slight worsening renal function noted, I will start her on losartan HCT 50/12.5 mg in the morning, will obtain NT proBNP along with BMP in 2 weeks and like to see her back before the Christmas in 2 to 3 weeks from now.  Extensive discussion was held as dictated above regarding specifically the diet and weight loss.  She appears motivated.   Adrian Prows, MD, [facility] 06/27/2022, 9:18 AM Office: [phone] Fax: [phone] Pager: [phone]

## 2022-06-29 ENCOUNTER — Ambulatory Visit: Payer: Medicare Other

## 2022-06-29 DIAGNOSIS — I5033 Acute on chronic diastolic (congestive) heart failure: Secondary | ICD-10-CM

## 2022-07-10 ENCOUNTER — Ambulatory Visit: Payer: Medicare Other | Admitting: Cardiology

## 2022-07-11 ENCOUNTER — Telehealth: Payer: Self-pay

## 2022-07-11 LAB — BASIC METABOLIC PANEL
BUN/Creatinine Ratio: 14 (ref 12–28)
BUN: 16 mg/dL (ref 8–27)
CO2: 21 mmol/L (ref 20–29)
Calcium: 9.4 mg/dL (ref 8.7–10.3)
Chloride: 102 mmol/L (ref 96–106)
Creatinine, Ser: 1.14 mg/dL — ABNORMAL HIGH (ref 0.57–1.00)
Glucose: 95 mg/dL (ref 70–99)
Potassium: 4.3 mmol/L (ref 3.5–5.2)
Sodium: 139 mmol/L (ref 134–144)
eGFR: 53 mL/min/{1.73_m2} — ABNORMAL LOW (ref >59)

## 2022-07-11 LAB — PRO B NATRIURETIC PEPTIDE: NT-Pro BNP: 1182 pg/mL — ABNORMAL HIGH (ref 0–301)

## 2022-07-11 NOTE — Telephone Encounter (Signed)
Patient called and asked based on her blood tests are you going to suggest she go in he hospital. She has f/u with you 07/13/22, she wanted to just be prepared. Thanks

## 2022-07-11 NOTE — Progress Notes (Signed)
Aware of labs. Discussed earlier today with the patient. Will see tomorrow.  Thanks MJP

## 2022-07-11 NOTE — Telephone Encounter (Signed)
Called patient to inform her about the message above. Patient understood

## 2022-07-11 NOTE — Telephone Encounter (Signed)
Not unless she is having severe shortness of breath.  Take lasix 40 mg daily. Will see her on 12/22.  Thanks MJP

## 2022-07-13 ENCOUNTER — Encounter: Payer: Self-pay | Admitting: Cardiology

## 2022-07-13 ENCOUNTER — Ambulatory Visit: Payer: Medicare Other | Admitting: Cardiology

## 2022-07-13 VITALS — BP 119/83 | HR 80 | Resp 16 | Ht 67.0 in | Wt 294.0 lb

## 2022-07-13 DIAGNOSIS — I4811 Longstanding persistent atrial fibrillation: Secondary | ICD-10-CM

## 2022-07-13 DIAGNOSIS — I10 Essential (primary) hypertension: Secondary | ICD-10-CM

## 2022-07-13 DIAGNOSIS — I5032 Chronic diastolic (congestive) heart failure: Secondary | ICD-10-CM

## 2022-07-13 NOTE — Telephone Encounter (Signed)
From patient.

## 2022-07-13 NOTE — Progress Notes (Signed)
Follow up visit  Subjective:   Sharon Acosta, female    DOB: 1956-05-12, 66 y.o.   MRN: [id number]   Chief Complaint  Patient presents with   Congestive Heart Failure   Atrial Fibrillation   Follow-up    3 WEEKS    HPI  66 y.o. Caucasian female, with obesity, former smoker, hypertension, persistnet Afib  Reviewed recent test results with the patient, details below.  Patient's breathing and leg edema have improved.  She is currently very busy with holidays shopping etc. and only takes Lasix if more than usual shortness of breath or leg edema is present.   Current Outpatient Medications:    apixaban (ELIQUIS) 5 MG TABS tablet, Take 1 tablet (5 mg total) by mouth 2 (two) times daily., Disp: 180 tablet, Rfl: 3   buPROPion (WELLBUTRIN XL) 300 MG 24 hr tablet, Take 300 mg by mouth every morning., Disp: , Rfl:    diltiazem (CARDIZEM CD) 360 MG 24 hr capsule, Take 1 capsule (360 mg total) by mouth daily., Disp: 90 capsule, Rfl: 3   furosemide (LASIX) 40 MG tablet, Take 1 tablet (40 mg total) by mouth daily as needed for fluid or edema., Disp: 30 tablet, Rfl: 2   hydrALAZINE (APRESOLINE) 50 MG tablet, Take 1 tablet (50 mg total) by mouth 3 (three) times daily., Disp: 90 tablet, Rfl: 2   hydrOXYzine (ATARAX/VISTARIL) 25 MG tablet, Take 25 mg by mouth 3 (three) times daily as needed., Disp: , Rfl:    isosorbide dinitrate (ISORDIL) 30 MG tablet, Take 1 tablet (30 mg total) by mouth 3 (three) times daily., Disp: 90 tablet, Rfl: 2   losartan-hydrochlorothiazide (HYZAAR) 50-12.5 MG tablet, Take 1 tablet by mouth every morning., Disp: 30 tablet, Rfl: 2   Magnesium 250 MG TABS, Take 250 mg by mouth daily with breakfast., Disp: , Rfl:    metoprolol tartrate (LOPRESSOR) 100 MG tablet, Take 1 tablet (100 mg total) by mouth 2 (two) times daily., Disp: 90 tablet, Rfl: 3   Potassium 99 MG TABS, Take 99 mg by mouth daily as needed. Take with furosemide, Disp: , Rfl:    pyridOXINE (VITAMIN B-6) 100 MG  tablet, Take 100 mg by mouth daily with breakfast., Disp: , Rfl:   Cardiovascular studies:  EKG 07/13/2022: Atrial fibrillation 86 bpm  Echocardiogram 06/29/2022: Study Quality: Technically difficult study. Normal LV systolic function with visual EF 55-60%. Left ventricle cavity is normal in size. Normal left ventricular wall thickness. Normal global wall motion. Unable to evaluate diastolic function due to atrial fibrillation. Right atrial cavity is dilated. Right ventricle cavity is normal in size. Mildly reduced right ventricular function. Mild tricuspid regurgitation. Mild pulmonary hypertension. RVSP measures 39 mmHg. IVC is dilated with a respiratory response of >50%, estimated RAP mmHg. The aortic root is dilated (Sinus of Valsalva 40 mm, Sinotubular junction 39 mm) Mildly dilated proximal ascending aorta at 39 mm. Compared to 06/07/2020 right heart findings appear to be new, mild PHTN is new, dilatation of aorta new finding, otherwise no significant change.   EKG 05/16/2022: Atrial fibrillation with rapid ventricular rate 125 bpm  Nonspecific ST depression -Nondiagnostic.  Mobile cardiac telemetry 13 days 12/20/2020 - 01/03/2021: Dominant rhythm: Sinus. HR 51-190 bpm. Avg HR 101 bpm. 1 episode of VT, 190 bpm for 4 beats. <1% isolated VE, couplets. 100% Afib burden, rate 51-180 bpm. No atrial flutter/SVT/high grade AV block, sinus pause >3sec noted. 2 patient triggered events.  1 associated with Afib with VR 77-134 bpm, other with  Afib with VR 71-103 bpm, occasional ventricular ectopy  Echocardiogram 06/07/2020:  Left ventricle cavity is normal in size. Normal global wall motion.  Indeterminate diastolic filling pattern. Unable to evaluate diastolic  function due to atrial fibrillation. Normal LV systolic function with  visual EF 55-60%. Calculated EF 52%.  Left atrial cavity is mildly dilated by volume.  Structurally normal mitral valve.  Mild (Grade I) mitral  regurgitation.  Structurally normal tricuspid valve.  Mild tricuspid regurgitation. No  evidence of pulmonary hypertension.  CTA Chest 02/12/2019: 1. No definite evidence of pulmonary embolus. 2. Dense consolidation of the left lower lobe, scattered ground-glass airspace opacity at the left lung, and small to moderate left-sided pleural effusion. This may reflect asymmetric pulmonary edema or possibly pneumonia. 3. Soft tissue edema along the anterior chest wall, left breast and mid back.    Recent labs: 07/10/2022: Glucose 95, BUN/Cr 16/1.14. EGFR 53. Na/K 139/4.3 NT-proBNP 1182  12/20/2020: Glucose 93, BUN/Cr 16/0.94. EGFR 67. Na/K 140/4.2.  TSH 2.1 normal  05/30/2020: Glucose 89, BUN/Cr 19/1.02. EGFR 58, Na/K 142/4.4.   03/25/2019: Glucose 97, BUN/Cr 22/0.9. EGFR 59. Na/K 142/4.2.  H/H 16/49. MCV 91. Platelets 429 Chol 97, TG 123, HDL 26, LDL 46 TSH 1.6 normal   Review of Systems  Cardiovascular:  Negative for chest pain, dyspnea on exertion, leg swelling, palpitations and syncope.        Vitals:   07/13/22 0929  BP: 119/83  Pulse: 80  Resp: 16  SpO2: 95%     Body mass index is 46.05 kg/m. Filed Weights   07/13/22 0929  Weight: 294 lb (133.4 kg)     Objective:   Physical Exam Vitals and nursing note reviewed.  Constitutional:      General: She is not in acute distress. Neck:     Vascular: No JVD.  Cardiovascular:     Rate and Rhythm: Tachycardia present. Rhythm irregular.     Heart sounds: Normal heart sounds. No murmur heard. Pulmonary:     Effort: Pulmonary effort is normal.     Breath sounds: Normal breath sounds. No wheezing or rales.  Musculoskeletal:     Right lower leg: No edema.     Left lower leg: No edema.           Assessment & Recommendations:   66 y.o. Caucasian female, with obesity former smoker, hypertension, persistent Afib  Acute on chronic HFpEF: Improving.  I discussed initiation of Farxiga, but she would like to hold  off for now.  Continue Lasix as needed.  Continue management of her A-fib as below.  Persistent Afib:  Rate now better controlled on Metoprolol tartrate 100 mg twice daily, diltiazem 360 mg daily. Mild right-sided dilatation on echocardiogram, but left atrium is not significantly dilated.  It is reasonable to consider repeat attempt of cardioversion at some point.  Patient will let me know if and when she would like to consider this. [id number] score. Continue eliquis 5 mg bid.   Hypertension: As above.    F/u in 4 weeks, with or without cardioversion    Nigel Mormon, MD Pager: [phone] Office: [phone]

## 2022-08-14 ENCOUNTER — Encounter (HOSPITAL_COMMUNITY): Payer: Self-pay | Admitting: Cardiology

## 2022-08-14 ENCOUNTER — Encounter (HOSPITAL_COMMUNITY)
Admission: RE | Disposition: A | Discharge status: Home / Self Care | Payer: Self-pay | Source: Home / Self Care | Attending: Cardiology

## 2022-08-14 ENCOUNTER — Ambulatory Visit (HOSPITAL_BASED_OUTPATIENT_CLINIC_OR_DEPARTMENT_OTHER): Payer: Medicare Other | Admitting: Certified Registered Nurse Anesthetist

## 2022-08-14 ENCOUNTER — Ambulatory Visit (HOSPITAL_COMMUNITY): Payer: Medicare Other | Admitting: Certified Registered Nurse Anesthetist

## 2022-08-14 ENCOUNTER — Ambulatory Visit (HOSPITAL_COMMUNITY)
Admission: RE | Admit: 2022-08-14 | Discharge: 2022-08-14 | Disposition: A | Discharge status: Home / Self Care | Payer: Medicare Other | Attending: Cardiology | Admitting: Cardiology

## 2022-08-14 DIAGNOSIS — Z8249 Family history of ischemic heart disease and other diseases of the circulatory system: Secondary | ICD-10-CM | POA: Diagnosis not present

## 2022-08-14 DIAGNOSIS — I4811 Longstanding persistent atrial fibrillation: Secondary | ICD-10-CM | POA: Diagnosis not present

## 2022-08-14 DIAGNOSIS — I110 Hypertensive heart disease with heart failure: Secondary | ICD-10-CM | POA: Diagnosis not present

## 2022-08-14 DIAGNOSIS — I5032 Chronic diastolic (congestive) heart failure: Secondary | ICD-10-CM | POA: Diagnosis not present

## 2022-08-14 DIAGNOSIS — E669 Obesity, unspecified: Secondary | ICD-10-CM | POA: Diagnosis not present

## 2022-08-14 DIAGNOSIS — Z87891 Personal history of nicotine dependence: Secondary | ICD-10-CM | POA: Diagnosis not present

## 2022-08-14 DIAGNOSIS — I4891 Unspecified atrial fibrillation: Secondary | ICD-10-CM | POA: Insufficient documentation

## 2022-08-14 DIAGNOSIS — Z7901 Long term (current) use of anticoagulants: Secondary | ICD-10-CM | POA: Diagnosis not present

## 2022-08-14 DIAGNOSIS — Z6841 Body Mass Index (BMI) 40.0 and over, adult: Secondary | ICD-10-CM | POA: Diagnosis not present

## 2022-08-14 DIAGNOSIS — I509 Heart failure, unspecified: Secondary | ICD-10-CM | POA: Diagnosis not present

## 2022-08-14 HISTORY — PX: CARDIOVERSION: SHX1299

## 2022-08-14 LAB — POCT I-STAT, CHEM 8
BUN: 33 mg/dL — ABNORMAL HIGH (ref 8–23)
Calcium, Ion: 1.1 mmol/L — ABNORMAL LOW (ref 1.15–1.40)
Chloride: 98 mmol/L (ref 98–111)
Creatinine, Ser: 1.6 mg/dL — ABNORMAL HIGH (ref 0.44–1.00)
Glucose, Bld: 102 mg/dL — ABNORMAL HIGH (ref 70–99)
HCT: 52 % — ABNORMAL HIGH (ref 36.0–46.0)
Hemoglobin: 17.7 g/dL — ABNORMAL HIGH (ref 12.0–15.0)
Potassium: 3.7 mmol/L (ref 3.5–5.1)
Sodium: 141 mmol/L (ref 135–145)
TCO2: 28 mmol/L (ref 22–32)

## 2022-08-14 SURGERY — CARDIOVERSION
Anesthesia: General

## 2022-08-14 MED ORDER — PROPOFOL 10 MG/ML IV BOLUS
INTRAVENOUS | Status: DC | PRN
  Administered 2022-08-14: 10 mg via INTRAVENOUS
  Administered 2022-08-14: 40 mg via INTRAVENOUS
  Administered 2022-08-14 (×2): 20 mg via INTRAVENOUS

## 2022-08-14 MED ORDER — SODIUM CHLORIDE 0.9 % IV SOLN: INTRAVENOUS | Status: DC

## 2022-08-14 NOTE — Transfer of Care (Signed)
Immediate Anesthesia Transfer of Care Note  Patient: Sharon Acosta  Procedure(s) Performed: CARDIOVERSION  Patient Location: Endoscopy Unit  Anesthesia Type:General  Level of Consciousness: drowsy and patient cooperative  Airway & Oxygen Therapy: Patient Spontanous Breathing and Patient connected to nasal cannula oxygen  Post-op Assessment: Report given to RN, Post -op Vital signs reviewed and stable, and Patient moving all extremities X 4  Post vital signs: Reviewed and stable  Last Vitals:  Vitals Value Taken Time  BP 98/66   Temp    Pulse 78   Resp 18   SpO2 99     Last Pain:  Vitals:   08/14/22 0657  TempSrc: Temporal  PainSc: 0-No pain         Complications: No notable events documented.

## 2022-08-14 NOTE — Anesthesia Preprocedure Evaluation (Signed)
Anesthesia Evaluation  Patient identified by MRN, date of birth, ID band Patient awake    Reviewed: Allergy & Precautions, NPO status , Patient's Chart, lab work & pertinent test results  History of Anesthesia Complications Negative for: history of anesthetic complications  Airway Mallampati: III  TM Distance: >3 FB Neck ROM: Full    Dental  (+) Dental Advisory Given, Teeth Intact, Missing, Chipped, Partial Upper,    Pulmonary neg shortness of breath, neg sleep apnea, neg COPD, neg recent URI, former smoker   breath sounds clear to auscultation       Cardiovascular hypertension, Pt. on medications (-) angina +CHF  (-) Past MI + dysrhythmias  Rhythm:Irregular  1. The left ventricle has low normal systolic function, with an ejection  fraction of 50-55%. The cavity size was normal. There is mildly increased  left ventricular wall thickness. Left ventricular diastolic function could  not be evaluated secondary to  atrial fibrillation. Indeterminate filling pressures No evidence of left  ventricular regional wall motion abnormalities.   2. The right ventricle has normal systolic function. The cavity was  normal. There is no increase in right ventricular wall thickness. Right  ventricular systolic pressure is moderately elevated at 56mmHg.   3. Right atrial size was moderately dilated.   4. There is mild to moderate mitral annular calcification present.   5. The aorta is normal in size and structure.   6. Large pleural effusion in the left lateral region.   7. The inferior vena cava was dilated in size with <50% respiratory  variability.     Neuro/Psych  PSYCHIATRIC DISORDERS Anxiety     negative neurological ROS     GI/Hepatic negative GI ROS, Neg liver ROS,,,  Endo/Other    Renal/GU Renal diseaseLab Results      Component                Value               Date                      CREATININE               1.60 (H)             08/14/2022                Musculoskeletal negative musculoskeletal ROS (+)    Abdominal   Peds  Hematology Lab Results      Component                Value               Date                      WBC                      10.2                02/24/2019                HGB                      17.7 (H)            08/14/2022                HCT  52.0 (H)            08/14/2022                MCV                      91.3                02/24/2019                PLT                      429 (H)             02/24/2019             eliquis   Anesthesia Other Findings   Reproductive/Obstetrics                              Anesthesia Physical Anesthesia Plan  ASA: 3  Anesthesia Plan: General   Post-op Pain Management: Minimal or no pain anticipated   Induction: Intravenous  PONV Risk Score and Plan: 3 and Treatment may vary due to age or medical condition  Airway Management Planned: Mask  Additional Equipment: None  Intra-op Plan:   Post-operative Plan:   Informed Consent: I have reviewed the patients History and Physical, chart, labs and discussed the procedure including the risks, benefits and alternatives for the proposed anesthesia with the patient or authorized representative who has indicated his/her understanding and acceptance.     Dental advisory given  Plan Discussed with: CRNA  Anesthesia Plan Comments:          Anesthesia Quick Evaluation

## 2022-08-14 NOTE — Interval H&P Note (Signed)
History and Physical Interval Note:  08/14/2022 7:39 AM  Sharon Acosta  has presented today for surgery, with the diagnosis of LONG STANDING PERSISTENT AFIB.  The various methods of treatment have been discussed with the patient and family. After consideration of risks, benefits and other options for treatment, the patient has consented to  Procedure(s): CARDIOVERSION (N/A) as a surgical intervention.  The patient's history has been reviewed, patient examined, no change in status, stable for surgery.  I have reviewed the patient's chart and labs.  Questions were answered to the patient's satisfaction.     [place]

## 2022-08-14 NOTE — Discharge Instructions (Signed)
Electrical Cardioversion °Electrical cardioversion is the delivery of a jolt of electricity to restore a normal rhythm to the heart. A rhythm that is too fast or is not regular keeps the heart from pumping well. In this procedure, sticky patches or metal paddles are placed on the chest to deliver electricity to the heart from a device. °This procedure may be done in an emergency if: °There is low or no blood pressure as a result of the heart rhythm. °Normal rhythm must be restored as fast as possible to protect the brain and heart from further damage. °It may save a life. °This may also be a scheduled procedure for irregular or fast heart rhythms that are not immediately life-threatening. ° °What can I expect after the procedure? °Your blood pressure, heart rate, breathing rate, and blood oxygen level will be monitored until you leave the hospital or clinic. °Your heart rhythm will be watched to make sure it does not change. °You may have some redness on the skin where the shocks were given. Over the counter cortizone cream may be helpful.  °Follow these instructions at home: °Do not drive for 24 hours if you were given a sedative during your procedure. °Take over-the-counter and prescription medicines only as told by your health care provider. °Ask your health care provider how to check your pulse. Check it often. °Rest for 48 hours after the procedure or as told by your health care provider. °Avoid or limit your caffeine use as told by your health care provider. °Keep all follow-up visits as told by your health care provider. This is important. °Contact a health care provider if: °You feel like your heart is beating too quickly or your pulse is not regular. °You have a serious muscle cramp that does not go away. °Get help right away if: °You have discomfort in your chest. °You are dizzy or you feel faint. °You have trouble breathing or you are short of breath. °Your speech is slurred. °You have trouble moving an  arm or leg on one side of your body. °Your fingers or toes turn cold or blue. °Summary °Electrical cardioversion is the delivery of a jolt of electricity to restore a normal rhythm to the heart. °This procedure may be done right away in an emergency or may be a scheduled procedure if the condition is not an emergency. °Generally, this is a safe procedure. °After the procedure, check your pulse often as told by your health care provider. °This information is not intended to replace advice given to you by your health care provider. Make sure you discuss any questions you have with your health care provider. °Document Revised: 02/09/2019 Document Reviewed: 02/09/2019 °Elsevier Patient Education © 2020 [employer]  °

## 2022-08-14 NOTE — H&P (Signed)
Sharon Acosta is an 67 y.o. female.   Chief Complaint: Afib HPI:    67 y.o. Caucasian female, with obesity, former smoker, hypertension, persistnet Afib   Past Medical History:  Diagnosis Date   Acute on chronic heart failure with preserved ejection fraction ([place]) 01/2019   Anxiety    Atrial fibrillation ([place]) 01/2019   Hypertension    Pleural effusion     Past Surgical History:  Procedure Laterality Date   CARDIOVERSION N/A 03/25/2019   Procedure: CARDIOVERSION;  Surgeon: Nigel Mormon, MD;  Location: [facility];  Service: Cardiovascular;  Laterality: N/A;   WISDOM TOOTH EXTRACTION       Family History  Problem Relation Age of Onset   Atrial fibrillation Mother    COPD Mother    Hypertension Father    Heart disease Sister    Heart disease Brother     Social History:  reports that she quit smoking about 12 months ago. Her smoking use included cigarettes. She has a 20.00 pack-year smoking history. She has never used smokeless tobacco. She reports that she does not currently use alcohol. She reports that she does not use drugs.  Allergies: No Known Allergies  ROS   Blood pressure 111/84, pulse 83, temperature (!) 97.4 F (36.3 C), temperature source Temporal, resp. rate 20, height 5\' 7"  (1.702 m), weight 129.3 kg, SpO2 95 %. Body mass index is 44.64 kg/m.   Physical Exam   Medications Prior to Admission  Medication Sig Dispense Refill   apixaban (ELIQUIS) 5 MG TABS tablet Take 1 tablet (5 mg total) by mouth 2 (two) times daily. 180 tablet 3   buPROPion (WELLBUTRIN XL) 300 MG 24 hr tablet Take 300 mg by mouth every morning.     diltiazem (CARDIZEM CD) 360 MG 24 hr capsule Take 1 capsule (360 mg total) by mouth daily. 90 capsule 3   furosemide (LASIX) 40 MG tablet Take 1 tablet (40 mg total) by mouth daily as needed for fluid or edema. (Patient taking differently: Take 40 mg by mouth daily.) 30 tablet 2   hydrALAZINE (APRESOLINE) 50 MG tablet Take 1 tablet (50  mg total) by mouth 3 (three) times daily. 90 tablet 2   hydrOXYzine (ATARAX/VISTARIL) 25 MG tablet Take 25 mg by mouth 3 (three) times daily as needed for anxiety.     isosorbide dinitrate (ISORDIL) 30 MG tablet Take 1 tablet (30 mg total) by mouth 3 (three) times daily. 90 tablet 2   losartan-hydrochlorothiazide (HYZAAR) 50-12.5 MG tablet Take 1 tablet by mouth every morning. 30 tablet 2   Magnesium 250 MG TABS Take 250 mg by mouth daily with breakfast.     metoprolol tartrate (LOPRESSOR) 100 MG tablet Take 1 tablet (100 mg total) by mouth 2 (two) times daily. 90 tablet 3   Potassium 99 MG TABS Take 99 mg by mouth daily. Take with furosemide     pyridOXINE (VITAMIN B-6) 100 MG tablet Take 100 mg by mouth daily with breakfast.        Current Facility-Administered Medications:    0.9 %  sodium chloride infusion, , Intravenous, Continuous, Westlee Devita J, MD, Last Rate: 20 mL/hr at 08/14/22 0715, Continued from Pre-op at 08/14/22 0715   Today's Vitals   08/14/22 0657  BP: 111/84  Pulse: 83  Resp: 20  Temp: (!) 97.4 F (36.3 C)  TempSrc: Temporal  SpO2: 95%  Weight: 129.3 kg  Height: 5\' 7"  (1.702 m)  PainSc: 0-No pain   Body mass index  is 44.64 kg/m.  EKG 07/13/2022: Atrial fibrillation 86 bpm   Echocardiogram 06/29/2022: Study Quality: Technically difficult study. Normal LV systolic function with visual EF 55-60%. Left ventricle cavity is normal in size. Normal left ventricular wall thickness. Normal global wall motion. Unable to evaluate diastolic function due to atrial fibrillation. Right atrial cavity is dilated. Right ventricle cavity is normal in size. Mildly reduced right ventricular function. Mild tricuspid regurgitation. Mild pulmonary hypertension. RVSP measures 39 mmHg. IVC is dilated with a respiratory response of >50%, estimated RAP mmHg. The aortic root is dilated (Sinus of Valsalva 40 mm, Sinotubular junction 39 mm) Mildly dilated proximal ascending  aorta at 39 mm. Compared to 06/07/2020 right heart findings appear to be new, mild PHTN is new, dilatation of aorta new finding, otherwise no significant change.     EKG 05/16/2022: Atrial fibrillation with rapid ventricular rate 125 bpm  Nonspecific ST depression -Nondiagnostic.   Mobile cardiac telemetry 13 days 12/20/2020 - 01/03/2021: Dominant rhythm: Sinus. HR 51-190 bpm. Avg HR 101 bpm. 1 episode of VT, 190 bpm for 4 beats. <1% isolated VE, couplets. 100% Afib burden, rate 51-180 bpm. No atrial flutter/SVT/high grade AV block, sinus pause >3sec noted. 2 patient triggered events.  1 associated with Afib with VR 77-134 bpm, other with Afib with VR 71-103 bpm, occasional ventricular ectopy   Echocardiogram 06/07/2020:  Left ventricle cavity is normal in size. Normal global wall motion.  Indeterminate diastolic filling pattern. Unable to evaluate diastolic  function due to atrial fibrillation. Normal LV systolic function with  visual EF 55-60%. Calculated EF 52%.  Left atrial cavity is mildly dilated by volume.  Structurally normal mitral valve.  Mild (Grade I) mitral regurgitation.  Structurally normal tricuspid valve.  Mild tricuspid regurgitation. No  evidence of pulmonary hypertension.   CTA Chest 02/12/2019: 1. No definite evidence of pulmonary embolus. 2. Dense consolidation of the left lower lobe, scattered ground-glass airspace opacity at the left lung, and small to moderate left-sided pleural effusion. This may reflect asymmetric pulmonary edema or possibly pneumonia. 3. Soft tissue edema along the anterior chest wall, left breast and mid back.    Recent labs: 07/10/2022: Glucose 95, BUN/Cr 16/1.14. EGFR 53. Na/K 139/4.3 NT-proBNP 1182   12/20/2020: Glucose 93, BUN/Cr 16/0.94. EGFR 67. Na/K 140/4.2.  TSH 2.1 normal   05/30/2020: Glucose 89, BUN/Cr 19/1.02. EGFR 58, Na/K 142/4.4.    03/25/2019: Glucose 97, BUN/Cr 22/0.9. EGFR 59. Na/K 142/4.2.  H/H 16/49. MCV  91. Platelets 429 Chol 97, TG 123, HDL 26, LDL 46 TSH 1.6 normal     Assessment & Recommendations:  67 y.o. Caucasian female, with obesity, former smoker, hypertension, persistnet Afib   Plan for cardioversion    Nigel Mormon, MD Pager: [phone] Office: [phone]

## 2022-08-14 NOTE — CV Procedure (Signed)
Direct current cardioversion:  Indication symptomatic: Symptomatic atrial fibrillation  Procedure: Under deep sedation administered and monitored by anesthesiology, synchronized direct current cardioversion performed. Patient was delivered with 129, 150, 200, 200 Joules of electricity X 4 with no success. Afib remained. Patient tolerated the procedure well. No immediate complication noted.   Nigel Mormon, MD [facility] Pager: [phone] Office: [phone]

## 2022-08-15 NOTE — Anesthesia Postprocedure Evaluation (Signed)
Anesthesia Post Note  Patient: Shashana Fullington  Procedure(s) Performed: CARDIOVERSION     Patient location during evaluation: Endoscopy Anesthesia Type: General Level of consciousness: awake and alert Pain management: pain level controlled Vital Signs Assessment: post-procedure vital signs reviewed and stable Respiratory status: spontaneous breathing, nonlabored ventilation and respiratory function stable Cardiovascular status: stable Postop Assessment: no apparent nausea or vomiting Anesthetic complications: no   No notable events documented.  Last Vitals:  Vitals:   08/14/22 0805 08/14/22 0810  BP: 94/73 98/64  Pulse: 72 70  Resp: 16 17  Temp:    SpO2: 92% 94%    Last Pain:  Vitals:   08/14/22 0810  TempSrc:   PainSc: 0-No pain                 Brooklinn Longbottom

## 2022-08-17 ENCOUNTER — Ambulatory Visit: Payer: Medicare Other | Admitting: Cardiology

## 2022-08-17 ENCOUNTER — Encounter: Payer: Self-pay | Admitting: Cardiology

## 2022-08-17 VITALS — BP 107/75 | HR 84 | Resp 14 | Ht 67.0 in | Wt 286.2 lb

## 2022-08-17 DIAGNOSIS — I4811 Longstanding persistent atrial fibrillation: Secondary | ICD-10-CM

## 2022-08-17 DIAGNOSIS — I5032 Chronic diastolic (congestive) heart failure: Secondary | ICD-10-CM

## 2022-08-17 MED ORDER — FUROSEMIDE 40 MG PO TABS: 40.0000 mg | ORAL_TABLET | ORAL | 0 refills | Status: DC | PRN

## 2022-08-17 MED ORDER — EMPAGLIFLOZIN 10 MG PO TABS: 10.0000 mg | ORAL_TABLET | Freq: Every day | ORAL | 3 refills | Status: DC

## 2022-08-17 NOTE — Progress Notes (Signed)
Follow up visit  Subjective:   Sharon Acosta, female    DOB: [birth year], 67 y.o.   MRN: [id number]   Chief Complaint  Patient presents with   Longstanding persistent atrial fibrillation   Congestive Heart Failure   Follow-up    HPI  67 y.o. Caucasian female, with obesity, former smoker, hypertension, persistnet Afib  Patient had an unsuccessful attempt at cardioversion. She stiull has exertional dyspnea with minimal activity.    Current Outpatient Medications:    apixaban (ELIQUIS) 5 MG TABS tablet, Take 1 tablet (5 mg total) by mouth 2 (two) times daily., Disp: 180 tablet, Rfl: 3   buPROPion (WELLBUTRIN XL) 300 MG 24 hr tablet, Take 300 mg by mouth every morning., Disp: , Rfl:    diltiazem (CARDIZEM CD) 360 MG 24 hr capsule, Take 1 capsule (360 mg total) by mouth daily., Disp: 90 capsule, Rfl: 3   furosemide (LASIX) 40 MG tablet, Take 1 tablet (40 mg total) by mouth daily as needed for fluid or edema. (Patient taking differently: Take 40 mg by mouth daily.), Disp: 30 tablet, Rfl: 2   hydrALAZINE (APRESOLINE) 50 MG tablet, Take 1 tablet (50 mg total) by mouth 3 (three) times daily., Disp: 90 tablet, Rfl: 2   hydrOXYzine (ATARAX/VISTARIL) 25 MG tablet, Take 25 mg by mouth 3 (three) times daily as needed for anxiety., Disp: , Rfl:    isosorbide dinitrate (ISORDIL) 30 MG tablet, Take 1 tablet (30 mg total) by mouth 3 (three) times daily., Disp: 90 tablet, Rfl: 2   losartan-hydrochlorothiazide (HYZAAR) 50-12.5 MG tablet, Take 1 tablet by mouth every morning., Disp: 30 tablet, Rfl: 2   Magnesium 250 MG TABS, Take 250 mg by mouth daily with breakfast., Disp: , Rfl:    metoprolol tartrate (LOPRESSOR) 100 MG tablet, Take 1 tablet (100 mg total) by mouth 2 (two) times daily., Disp: 90 tablet, Rfl: 3   Potassium 99 MG TABS, Take 99 mg by mouth daily. Take with furosemide, Disp: , Rfl:    pyridOXINE (VITAMIN B-6) 100 MG tablet, Take 100 mg by mouth daily with breakfast., Disp: , Rfl:    Cardiovascular studies:  EKG 08/17/2022: Atrial fibrillation 87 bpm  Unsuccessful cardioversion attempt 08/14/2022  Echocardiogram 06/29/2022: Study Quality: Technically difficult study. Normal LV systolic function with visual EF 55-60%. Left ventricle cavity is normal in size. Normal left ventricular wall thickness. Normal global wall motion. Unable to evaluate diastolic function due to atrial fibrillation. Right atrial cavity is dilated. Right ventricle cavity is normal in size. Mildly reduced right ventricular function. Mild tricuspid regurgitation. Mild pulmonary hypertension. RVSP measures 39 mmHg. IVC is dilated with a respiratory response of >50%, estimated RAP mmHg. The aortic root is dilated (Sinus of Valsalva 40 mm, Sinotubular junction 39 mm) Mildly dilated proximal ascending aorta at 39 mm. Compared to 06/07/2020 right heart findings appear to be new, mild PHTN is new, dilatation of aorta new finding, otherwise no significant change.  Mobile cardiac telemetry 13 days 12/20/2020 - 01/03/2021: Dominant rhythm: Sinus. HR 51-190 bpm. Avg HR 101 bpm. 1 episode of VT, 190 bpm for 4 beats. <1% isolated VE, couplets. 100% Afib burden, rate 51-180 bpm. No atrial flutter/SVT/high grade AV block, sinus pause >3sec noted. 2 patient triggered events.  1 associated with Afib with VR 77-134 bpm, other with Afib with VR 71-103 bpm, occasional ventricular ectopy  Echocardiogram 06/07/2020:  Left ventricle cavity is normal in size. Normal global wall motion.  Indeterminate diastolic filling pattern. Unable to evaluate diastolic  function due to atrial fibrillation. Normal LV systolic function with  visual EF 55-60%. Calculated EF 52%.  Left atrial cavity is mildly dilated by volume.  Structurally normal mitral valve.  Mild (Grade I) mitral regurgitation.  Structurally normal tricuspid valve.  Mild tricuspid regurgitation. No  evidence of pulmonary hypertension.  CTA Chest  02/12/2019: 1. No definite evidence of pulmonary embolus. 2. Dense consolidation of the left lower lobe, scattered ground-glass airspace opacity at the left lung, and small to moderate left-sided pleural effusion. This may reflect asymmetric pulmonary edema or possibly pneumonia. 3. Soft tissue edema along the anterior chest wall, left breast and mid back.    Recent labs: 07/25/2022: Glucose 102, BUN/Cr 33/1.6. EGFR NA. Na/K 141/3.7.  H/H 17/52.   07/10/2022: Glucose 95, BUN/Cr 16/1.14. EGFR 53. Na/K 139/4.3 NT-proBNP 1182  03/25/2019: Glucose 97, BUN/Cr 22/0.9. EGFR 59. Na/K 142/4.2.  H/H 16/49. MCV 91. Platelets 429 Chol 97, TG 123, HDL 26, LDL 46 TSH 1.6 normal   Review of Systems  Cardiovascular:  Positive for dyspnea on exertion. Negative for chest pain, leg swelling, palpitations and syncope.        Vitals:   08/17/22 0837  BP: 107/75  Pulse: 84  Resp: 14  SpO2: 99%     Body mass index is 44.83 kg/m. Filed Weights   08/17/22 0837  Weight: 286 lb 3.2 oz (129.8 kg)     Objective:   Physical Exam Vitals and nursing note reviewed.  Constitutional:      General: She is not in acute distress. Neck:     Vascular: No JVD.  Cardiovascular:     Rate and Rhythm: Tachycardia present. Rhythm irregular.     Heart sounds: Normal heart sounds. No murmur heard. Pulmonary:     Effort: Pulmonary effort is normal.     Breath sounds: Normal breath sounds. No wheezing or rales.  Musculoskeletal:     Right lower leg: Edema (Trace) present.     Left lower leg: Edema (Trace) present.           Assessment & Recommendations:   67 y.o. Caucasian female, with obesity former smoker, hypertension, persistent Afib  Chronic HFpEF: Mild volume overload. I will start [place]. Use lasix only as needed. Discussed low salt diet. I am optimistic that once euvolemic, she should increase physical activity as tolerated. This will improve her overall conditioning.  Persistent  Afib:  Unsuccessful cardioversion attempt 07/2022.  At this point, I would deem her cardioversion as permanent.  Continue metoprolol tartrate 100 mg twice daily, diltiazem 360 mg daily. CHA2DS2VAsc score. Continue eliquis 5 mg bid.   Hypertension: Controlled.   F/u in 4-6 weeks    Nigel Mormon, MD Pager: [phone] Office: [phone]

## 2022-09-07 ENCOUNTER — Other Ambulatory Visit: Payer: Self-pay

## 2022-09-07 DIAGNOSIS — I4811 Longstanding persistent atrial fibrillation: Secondary | ICD-10-CM

## 2022-09-07 DIAGNOSIS — I1A0 Resistant hypertension: Secondary | ICD-10-CM

## 2022-09-07 MED ORDER — LOSARTAN POTASSIUM-HCTZ 50-12.5 MG PO TABS: 1.0000 | ORAL_TABLET | ORAL | 3 refills | Status: DC

## 2022-09-10 ENCOUNTER — Other Ambulatory Visit: Payer: Self-pay | Admitting: Cardiology

## 2022-09-10 DIAGNOSIS — I1A0 Resistant hypertension: Secondary | ICD-10-CM

## 2022-09-10 DIAGNOSIS — I4811 Longstanding persistent atrial fibrillation: Secondary | ICD-10-CM

## 2022-09-12 ENCOUNTER — Other Ambulatory Visit: Payer: Self-pay

## 2022-09-12 DIAGNOSIS — I4811 Longstanding persistent atrial fibrillation: Secondary | ICD-10-CM

## 2022-09-12 DIAGNOSIS — I1A0 Resistant hypertension: Secondary | ICD-10-CM

## 2022-09-12 MED ORDER — LOSARTAN POTASSIUM-HCTZ 50-12.5 MG PO TABS: 1.0000 | ORAL_TABLET | ORAL | 0 refills | Status: DC

## 2022-09-18 ENCOUNTER — Other Ambulatory Visit: Payer: Self-pay | Admitting: Cardiology

## 2022-09-18 DIAGNOSIS — I4811 Longstanding persistent atrial fibrillation: Secondary | ICD-10-CM

## 2022-09-18 DIAGNOSIS — I1A0 Resistant hypertension: Secondary | ICD-10-CM

## 2022-10-01 ENCOUNTER — Ambulatory Visit: Payer: Medicare Other | Admitting: Cardiology

## 2022-10-08 ENCOUNTER — Ambulatory Visit: Payer: Medicare Other | Admitting: Cardiology

## 2022-10-10 ENCOUNTER — Encounter: Payer: Self-pay | Admitting: Cardiology

## 2022-10-10 ENCOUNTER — Ambulatory Visit: Payer: Medicare Other | Admitting: Cardiology

## 2022-10-10 VITALS — BP 121/87 | HR 74 | Ht 67.0 in | Wt 288.0 lb

## 2022-10-10 DIAGNOSIS — I5032 Chronic diastolic (congestive) heart failure: Secondary | ICD-10-CM

## 2022-10-10 DIAGNOSIS — I4821 Permanent atrial fibrillation: Secondary | ICD-10-CM

## 2022-10-10 NOTE — Progress Notes (Signed)
Follow up visit  Subjective:   Sharon Acosta, female    DOB: Jul 17, 1956, 67 y.o.   MRN: [id number]   Chief Complaint  Patient presents with   Longstanding persistent atrial fibrillation [facility])   Follow-up    HPI  67 y.o. Caucasian female, with obesity, former smoker, hypertension, permanent Afib  Patient got discouraged after unsuccessful cardioversion in 07/2022 and gained some weight, but is feeling more energetic lately and hoping to increase her physical activity.  She denies overt exertional dyspnea, leg edema, orthopnea symptoms.    Current Outpatient Medications:    apixaban (ELIQUIS) 5 MG TABS tablet, Take 1 tablet (5 mg total) by mouth 2 (two) times daily., Disp: 180 tablet, Rfl: 3   buPROPion (WELLBUTRIN XL) 300 MG 24 hr tablet, Take 300 mg by mouth every morning., Disp: , Rfl:    diltiazem (CARDIZEM CD) 360 MG 24 hr capsule, Take 1 capsule (360 mg total) by mouth daily., Disp: 90 capsule, Rfl: 3   empagliflozin (JARDIANCE) 10 MG TABS tablet, Take 1 tablet (10 mg total) by mouth daily before breakfast., Disp: 30 tablet, Rfl: 3   furosemide (LASIX) 40 MG tablet, Take 1 tablet (40 mg total) by mouth as needed for fluid or edema., Disp: 1 tablet, Rfl: 0   hydrALAZINE (APRESOLINE) 50 MG tablet, TAKE 1 TABLET BY MOUTH THREE TIMES DAILY, Disp: 90 tablet, Rfl: 0   hydrOXYzine (ATARAX/VISTARIL) 25 MG tablet, Take 25 mg by mouth 3 (three) times daily as needed for anxiety., Disp: , Rfl:    isosorbide dinitrate (ISORDIL) 30 MG tablet, TAKE 1 TABLET BY MOUTH THREE TIMES DAILY, Disp: 90 tablet, Rfl: 0   losartan-hydrochlorothiazide (HYZAAR) 50-12.5 MG tablet, Take 1 tablet by mouth every morning., Disp: 100 tablet, Rfl: 0   Magnesium 250 MG TABS, Take 250 mg by mouth daily with breakfast., Disp: , Rfl:    metoprolol tartrate (LOPRESSOR) 100 MG tablet, Take 1 tablet (100 mg total) by mouth 2 (two) times daily., Disp: 90 tablet, Rfl: 3   pyridOXINE (VITAMIN B-6) 100 MG tablet, Take 100  mg by mouth daily with breakfast., Disp: , Rfl:   Cardiovascular studies:   EKG 08/17/2022: Atrial fibrillation 87 bpm  Unsuccessful cardioversion attempt 08/14/2022  Echocardiogram 06/29/2022: Study Quality: Technically difficult study. Normal LV systolic function with visual EF 55-60%. Left ventricle cavity is normal in size. Normal left ventricular wall thickness. Normal global wall motion. Unable to evaluate diastolic function due to atrial fibrillation. Right atrial cavity is dilated. Right ventricle cavity is normal in size. Mildly reduced right ventricular function. Mild tricuspid regurgitation. Mild pulmonary hypertension. RVSP measures 39 mmHg. IVC is dilated with a respiratory response of >50%, estimated RAP mmHg. The aortic root is dilated (Sinus of Valsalva 40 mm, Sinotubular junction 39 mm) Mildly dilated proximal ascending aorta at 39 mm. Compared to 06/07/2020 right heart findings appear to be new, mild PHTN is new, dilatation of aorta new finding, otherwise no significant change.  Mobile cardiac telemetry 13 days 12/20/2020 - 01/03/2021: Dominant rhythm: Sinus. HR 51-190 bpm. Avg HR 101 bpm. 1 episode of VT, 190 bpm for 4 beats. <1% isolated VE, couplets. 100% Afib burden, rate 51-180 bpm. No atrial flutter/SVT/high grade AV block, sinus pause >3sec noted. 2 patient triggered events.  1 associated with Afib with VR 77-134 bpm, other with Afib with VR 71-103 bpm, occasional ventricular ectopy  Echocardiogram 06/07/2020:  Left ventricle cavity is normal in size. Normal global wall motion.  Indeterminate diastolic filling pattern. Unable  to evaluate diastolic  function due to atrial fibrillation. Normal LV systolic function with  visual EF 55-60%. Calculated EF 52%.  Left atrial cavity is mildly dilated by volume.  Structurally normal mitral valve.  Mild (Grade I) mitral regurgitation.  Structurally normal tricuspid valve.  Mild tricuspid regurgitation. No   evidence of pulmonary hypertension.  CTA Chest 02/12/2019: 1. No definite evidence of pulmonary embolus. 2. Dense consolidation of the left lower lobe, scattered ground-glass airspace opacity at the left lung, and small to moderate left-sided pleural effusion. This may reflect asymmetric pulmonary edema or possibly pneumonia. 3. Soft tissue edema along the anterior chest wall, left breast and mid back.    Recent labs: 08/14/2022: Glucose 102, BUN/Cr 33/1.6.   07/25/2022: Glucose 102, BUN/Cr 33/1.6. EGFR NA. Na/K 141/3.7.  H/H 17/52.   07/10/2022: Glucose 95, BUN/Cr 16/1.14. EGFR 53. Na/K 139/4.3 NT-proBNP 1182  03/25/2019: Glucose 97, BUN/Cr 22/0.9. EGFR 59. Na/K 142/4.2.  H/H 16/49. MCV 91. Platelets 429 Chol 97, TG 123, HDL 26, LDL 46 TSH 1.6 normal   Review of Systems  Cardiovascular:  Positive for dyspnea on exertion. Negative for chest pain, leg swelling, palpitations and syncope.        Vitals:   10/10/22 0856  BP: 121/87  Pulse: 74  SpO2: 95%     Body mass index is 45.11 kg/m. Filed Weights   10/10/22 0856  Weight: 288 lb (130.6 kg)     Objective:   Physical Exam Vitals and nursing note reviewed.  Constitutional:      General: She is not in acute distress. Neck:     Vascular: No JVD.  Cardiovascular:     Rate and Rhythm: Tachycardia present. Rhythm irregular.     Heart sounds: Normal heart sounds. No murmur heard. Pulmonary:     Effort: Pulmonary effort is normal.     Breath sounds: Normal breath sounds. No wheezing or rales.  Musculoskeletal:     Right lower leg: No edema.     Left lower leg: No edema.           Assessment & Recommendations:   67 y.o. Caucasian female, with obesity former smoker, hypertension, persistent Afib  Chronic HFpEF: Fairly compensated today with no significant edema. Continue Jardiance.  Use Lasix only as needed. Check CMP, proBNP today.  Permanent Afib:  Unsuccessful cardioversion attempt 07/2022.  At  this point, I would deem her Afib as permanent.  Continue metoprolol tartrate 100 mg twice daily, diltiazem 360 mg daily. CHA2DS2VAsc score. Continue eliquis 5 mg bid.   Hypertension: Controlled.   F/u in 6 months     Nigel Mormon, MD Pager: [phone] Office: [phone]

## 2022-10-17 LAB — COMPREHENSIVE METABOLIC PANEL
ALT: 28 IU/L (ref 0–32)
AST: 21 IU/L (ref 0–40)
Albumin/Globulin Ratio: 1.4 (ref 1.2–2.2)
Albumin: 4 g/dL (ref 3.9–4.9)
Alkaline Phosphatase: 82 IU/L (ref 44–121)
BUN/Creatinine Ratio: 13 (ref 12–28)
BUN: 14 mg/dL (ref 8–27)
Bilirubin Total: 0.6 mg/dL (ref 0.0–1.2)
CO2: 24 mmol/L (ref 20–29)
Calcium: 9.2 mg/dL (ref 8.7–10.3)
Chloride: 100 mmol/L (ref 96–106)
Creatinine, Ser: 1.06 mg/dL — ABNORMAL HIGH (ref 0.57–1.00)
Globulin, Total: 2.8 g/dL (ref 1.5–4.5)
Glucose: 111 mg/dL — ABNORMAL HIGH (ref 70–99)
Potassium: 3.8 mmol/L (ref 3.5–5.2)
Sodium: 142 mmol/L (ref 134–144)
Total Protein: 6.8 g/dL (ref 6.0–8.5)
eGFR: 58 mL/min/{1.73_m2} — ABNORMAL LOW (ref >59)

## 2022-10-17 LAB — PRO B NATRIURETIC PEPTIDE: NT-Pro BNP: 1300 pg/mL — ABNORMAL HIGH (ref 0–301)

## 2022-10-19 ENCOUNTER — Other Ambulatory Visit: Payer: Self-pay | Admitting: Cardiology

## 2022-10-19 DIAGNOSIS — I1A0 Resistant hypertension: Secondary | ICD-10-CM

## 2022-10-19 DIAGNOSIS — I4811 Longstanding persistent atrial fibrillation: Secondary | ICD-10-CM

## 2022-10-24 ENCOUNTER — Other Ambulatory Visit: Payer: Self-pay

## 2022-10-24 DIAGNOSIS — I4811 Longstanding persistent atrial fibrillation: Secondary | ICD-10-CM

## 2022-11-01 DIAGNOSIS — Z1211 Encounter for screening for malignant neoplasm of colon: Secondary | ICD-10-CM | POA: Diagnosis not present

## 2022-11-01 DIAGNOSIS — I4891 Unspecified atrial fibrillation: Secondary | ICD-10-CM | POA: Diagnosis not present

## 2022-11-01 DIAGNOSIS — E669 Obesity, unspecified: Secondary | ICD-10-CM | POA: Diagnosis not present

## 2022-11-01 DIAGNOSIS — Z0000 Encounter for general adult medical examination without abnormal findings: Secondary | ICD-10-CM | POA: Diagnosis not present

## 2022-11-01 DIAGNOSIS — I10 Essential (primary) hypertension: Secondary | ICD-10-CM | POA: Diagnosis not present

## 2022-11-07 DIAGNOSIS — Z1231 Encounter for screening mammogram for malignant neoplasm of breast: Secondary | ICD-10-CM | POA: Diagnosis not present

## 2022-11-13 ENCOUNTER — Other Ambulatory Visit: Payer: Self-pay | Admitting: Cardiology

## 2022-11-13 DIAGNOSIS — I1A0 Resistant hypertension: Secondary | ICD-10-CM

## 2022-11-13 DIAGNOSIS — I4811 Longstanding persistent atrial fibrillation: Secondary | ICD-10-CM

## 2022-11-17 ENCOUNTER — Other Ambulatory Visit: Payer: Self-pay

## 2022-11-17 DIAGNOSIS — I4811 Longstanding persistent atrial fibrillation: Secondary | ICD-10-CM

## 2022-12-08 ENCOUNTER — Other Ambulatory Visit: Payer: Self-pay | Admitting: Cardiology

## 2022-12-08 DIAGNOSIS — I5032 Chronic diastolic (congestive) heart failure: Secondary | ICD-10-CM

## 2022-12-14 ENCOUNTER — Other Ambulatory Visit: Payer: Self-pay | Admitting: Cardiology

## 2022-12-14 DIAGNOSIS — I4811 Longstanding persistent atrial fibrillation: Secondary | ICD-10-CM

## 2022-12-14 DIAGNOSIS — I1A0 Resistant hypertension: Secondary | ICD-10-CM

## 2023-01-11 ENCOUNTER — Other Ambulatory Visit: Payer: Self-pay | Admitting: Cardiology

## 2023-01-11 DIAGNOSIS — I1A0 Resistant hypertension: Secondary | ICD-10-CM

## 2023-01-11 DIAGNOSIS — I5032 Chronic diastolic (congestive) heart failure: Secondary | ICD-10-CM

## 2023-01-11 DIAGNOSIS — I4811 Longstanding persistent atrial fibrillation: Secondary | ICD-10-CM

## 2023-01-16 ENCOUNTER — Other Ambulatory Visit: Payer: Self-pay | Admitting: Cardiology

## 2023-01-16 DIAGNOSIS — I4811 Longstanding persistent atrial fibrillation: Secondary | ICD-10-CM

## 2023-01-21 ENCOUNTER — Other Ambulatory Visit: Payer: Self-pay

## 2023-01-21 DIAGNOSIS — I1A0 Resistant hypertension: Secondary | ICD-10-CM

## 2023-01-21 DIAGNOSIS — I4811 Longstanding persistent atrial fibrillation: Secondary | ICD-10-CM

## 2023-01-21 MED ORDER — HYDRALAZINE HCL 50 MG PO TABS: 50.0000 mg | ORAL_TABLET | Freq: Three times a day (TID) | ORAL | 2 refills | Status: DC

## 2023-01-21 MED ORDER — ISOSORBIDE DINITRATE 30 MG PO TABS
30.0000 mg | ORAL_TABLET | Freq: Three times a day (TID) | ORAL | 2 refills | Status: DC
Start: 2023-01-21 — End: 2023-04-05

## 2023-02-06 DIAGNOSIS — K080 Exfoliation of teeth due to systemic causes: Secondary | ICD-10-CM | POA: Diagnosis not present

## 2023-02-12 DIAGNOSIS — K080 Exfoliation of teeth due to systemic causes: Secondary | ICD-10-CM | POA: Diagnosis not present

## 2023-02-18 ENCOUNTER — Telehealth: Payer: Self-pay

## 2023-02-18 NOTE — Telephone Encounter (Signed)
Pharmacy called and stated they only have the generic brand for Diltiazem. Wanted to know if the patient can get this brand inside. Pharmacy states the only difference about it is the release mechanism. They would like for you send over another script for it

## 2023-02-18 NOTE — Telephone Encounter (Signed)
Please ask them to specify the name of the forumation and dose they have abailable.  Thanks MJP

## 2023-02-20 ENCOUNTER — Other Ambulatory Visit: Payer: Self-pay

## 2023-02-20 NOTE — Telephone Encounter (Signed)
Tiazac Brand . The pharmacist said its the same medication but just a different brand. The one you ordered is on back order

## 2023-02-20 NOTE — Telephone Encounter (Signed)
Why do I need to send a different prescription then? Unless its expired. Please resend 90 pillsX3 refills.  Thanks MJP

## 2023-02-21 ENCOUNTER — Other Ambulatory Visit: Payer: Self-pay

## 2023-02-21 DIAGNOSIS — I4819 Other persistent atrial fibrillation: Secondary | ICD-10-CM

## 2023-02-21 MED ORDER — DILTIAZEM HCL ER COATED BEADS 360 MG PO CP24
360.0000 mg | ORAL_CAPSULE | Freq: Every day | ORAL | 3 refills | Status: DC
Start: 2023-02-21 — End: 2023-04-05

## 2023-02-23 ENCOUNTER — Other Ambulatory Visit: Payer: Self-pay | Admitting: Cardiology

## 2023-02-23 DIAGNOSIS — I5032 Chronic diastolic (congestive) heart failure: Secondary | ICD-10-CM

## 2023-02-27 ENCOUNTER — Other Ambulatory Visit: Payer: Self-pay | Admitting: Cardiology

## 2023-02-27 DIAGNOSIS — I4811 Longstanding persistent atrial fibrillation: Secondary | ICD-10-CM

## 2023-03-16 ENCOUNTER — Other Ambulatory Visit: Payer: Self-pay | Admitting: Cardiology

## 2023-03-16 DIAGNOSIS — I5032 Chronic diastolic (congestive) heart failure: Secondary | ICD-10-CM

## 2023-04-05 ENCOUNTER — Ambulatory Visit: Payer: Medicare Other | Admitting: Cardiology

## 2023-04-05 ENCOUNTER — Encounter: Payer: Self-pay | Admitting: Cardiology

## 2023-04-05 VITALS — BP 135/84 | HR 86 | Resp 16 | Ht 67.0 in | Wt 289.0 lb

## 2023-04-05 DIAGNOSIS — I5032 Chronic diastolic (congestive) heart failure: Secondary | ICD-10-CM | POA: Diagnosis not present

## 2023-04-05 DIAGNOSIS — I1A0 Resistant hypertension: Secondary | ICD-10-CM

## 2023-04-05 DIAGNOSIS — I4819 Other persistent atrial fibrillation: Secondary | ICD-10-CM

## 2023-04-05 DIAGNOSIS — I4821 Permanent atrial fibrillation: Secondary | ICD-10-CM | POA: Insufficient documentation

## 2023-04-05 DIAGNOSIS — I4811 Longstanding persistent atrial fibrillation: Secondary | ICD-10-CM

## 2023-04-05 MED ORDER — FUROSEMIDE 40 MG PO TABS
40.0000 mg | ORAL_TABLET | Freq: Every day | ORAL | 4 refills | Status: DC
Start: 2023-04-05 — End: 2023-04-05

## 2023-04-05 MED ORDER — LOSARTAN POTASSIUM-HCTZ 50-12.5 MG PO TABS
1.0000 | ORAL_TABLET | ORAL | 3 refills | Status: DC
Start: 2023-04-05 — End: 2024-04-08

## 2023-04-05 MED ORDER — DILTIAZEM HCL ER COATED BEADS 360 MG PO CP24
360.0000 mg | ORAL_CAPSULE | Freq: Every day | ORAL | 4 refills | Status: DC
Start: 2023-04-05 — End: 2024-04-08

## 2023-04-05 MED ORDER — EMPAGLIFLOZIN 10 MG PO TABS
10.0000 mg | ORAL_TABLET | Freq: Every day | ORAL | 3 refills | Status: DC
Start: 2023-04-05 — End: 2024-04-01

## 2023-04-05 MED ORDER — APIXABAN 5 MG PO TABS
5.0000 mg | ORAL_TABLET | Freq: Two times a day (BID) | ORAL | 4 refills | Status: DC
Start: 2023-04-05 — End: 2024-04-08

## 2023-04-05 MED ORDER — METOPROLOL TARTRATE 100 MG PO TABS
100.0000 mg | ORAL_TABLET | Freq: Two times a day (BID) | ORAL | 4 refills | Status: DC
Start: 2023-04-05 — End: 2024-04-08

## 2023-04-05 MED ORDER — ISOSORBIDE DINITRATE 30 MG PO TABS
30.0000 mg | ORAL_TABLET | Freq: Three times a day (TID) | ORAL | 4 refills | Status: DC
Start: 2023-04-05 — End: 2023-10-25

## 2023-04-05 MED ORDER — HYDRALAZINE HCL 50 MG PO TABS
50.0000 mg | ORAL_TABLET | Freq: Three times a day (TID) | ORAL | 4 refills | Status: DC
Start: 2023-04-05 — End: 2023-10-25

## 2023-04-05 MED ORDER — FUROSEMIDE 40 MG PO TABS
40.0000 mg | ORAL_TABLET | ORAL | 4 refills | Status: DC | PRN
Start: 2023-04-05 — End: 2024-04-08

## 2023-04-05 NOTE — Progress Notes (Signed)
Follow up visit  Subjective:   Sharon Acosta, female    DOB: 1955-11-25, 67 y.o.   MRN: [id number]   No chief complaint on file.   HPI  67 y.o. Caucasian female, with obesity, former smoker, hypertension, permanent Afib  Patient denies chest pain, shortness of breath, palpitations, leg edema, orthopnea, PND, TIA/syncope.     Current Outpatient Medications:    apixaban (ELIQUIS) 5 MG TABS tablet, Take 1 tablet (5 mg total) by mouth 2 (two) times daily., Disp: 180 tablet, Rfl: 3   buPROPion (WELLBUTRIN XL) 300 MG 24 hr tablet, Take 300 mg by mouth every morning., Disp: , Rfl:    diltiazem (CARDIZEM CD) 360 MG 24 hr capsule, Take 1 capsule (360 mg total) by mouth daily., Disp: 90 capsule, Rfl: 3   furosemide (LASIX) 40 MG tablet, TAKE 1 TABLET BY MOUTH ONCE DAILY AS NEEDED FOR FLUID, Disp: 30 tablet, Rfl: 6   hydrALAZINE (APRESOLINE) 50 MG tablet, Take 1 tablet (50 mg total) by mouth 3 (three) times daily., Disp: 270 tablet, Rfl: 2   hydrOXYzine (ATARAX/VISTARIL) 25 MG tablet, Take 25 mg by mouth 3 (three) times daily as needed for anxiety., Disp: , Rfl:    isosorbide dinitrate (ISORDIL) 30 MG tablet, Take 1 tablet (30 mg total) by mouth 3 (three) times daily., Disp: 270 tablet, Rfl: 2   JARDIANCE 10 MG TABS tablet, TAKE 1 TABLET BY MOUTH ONCE DAILY BEFORE BREAKFAST, Disp: 30 tablet, Rfl: 0   losartan-hydrochlorothiazide (HYZAAR) 50-12.5 MG tablet, Take 1 tablet by mouth every morning., Disp: 100 tablet, Rfl: 0   Magnesium 250 MG TABS, Take 250 mg by mouth daily with breakfast., Disp: , Rfl:    metoprolol tartrate (LOPRESSOR) 100 MG tablet, Take 1 tablet by mouth twice daily, Disp: 90 tablet, Rfl: 0   POTASSIUM CHLORIDE ER PO, Take 1 tablet by mouth as needed (takes with lasix as needed)., Disp: , Rfl:    pyridOXINE (VITAMIN B-6) 100 MG tablet, Take 100 mg by mouth daily with breakfast., Disp: , Rfl:   Cardiovascular studies:  EKG 04/05/2023: Atrial fibrillation 98 bpm Nonspecific  ST depression  Unsuccessful cardioversion attempt 08/14/2022  Echocardiogram 06/29/2022: Study Quality: Technically difficult study. Normal LV systolic function with visual EF 55-60%. Left ventricle cavity is normal in size. Normal left ventricular wall thickness. Normal global wall motion. Unable to evaluate diastolic function due to atrial fibrillation. Right atrial cavity is dilated. Right ventricle cavity is normal in size. Mildly reduced right ventricular function. Mild tricuspid regurgitation. Mild pulmonary hypertension. RVSP measures 39 mmHg. IVC is dilated with a respiratory response of >50%, estimated RAP mmHg. The aortic root is dilated (Sinus of Valsalva 40 mm, Sinotubular junction 39 mm) Mildly dilated proximal ascending aorta at 39 mm. Compared to 06/07/2020 right heart findings appear to be new, mild PHTN is new, dilatation of aorta new finding, otherwise no significant change.  Mobile cardiac telemetry 13 days 12/20/2020 - 01/03/2021: Dominant rhythm: Sinus. HR 51-190 bpm. Avg HR 101 bpm. 1 episode of VT, 190 bpm for 4 beats. <1% isolated VE, couplets. 100% Afib burden, rate 51-180 bpm. No atrial flutter/SVT/high grade AV block, sinus pause >3sec noted. 2 patient triggered events.  1 associated with Afib with VR 77-134 bpm, other with Afib with VR 71-103 bpm, occasional ventricular ectopy  Echocardiogram 06/07/2020:  Left ventricle cavity is normal in size. Normal global wall motion.  Indeterminate diastolic filling pattern. Unable to evaluate diastolic  function due to atrial fibrillation. Normal LV systolic  function with  visual EF 55-60%. Calculated EF 52%.  Left atrial cavity is mildly dilated by volume.  Structurally normal mitral valve.  Mild (Grade I) mitral regurgitation.  Structurally normal tricuspid valve.  Mild tricuspid regurgitation. No  evidence of pulmonary hypertension.  CTA Chest 02/12/2019: 1. No definite evidence of pulmonary embolus. 2. Dense  consolidation of the left lower lobe, scattered ground-glass airspace opacity at the left lung, and small to moderate left-sided pleural effusion. This may reflect asymmetric pulmonary edema or possibly pneumonia. 3. Soft tissue edema along the anterior chest wall, left breast and mid back.    Recent labs: 10/16/2022: Glucose 111, BUN/Cr 14/1.06. EGFR 58. Na/K 142/3.8. Rest of the CMP normal  08/14/2022: Glucose 102, BUN/Cr 33/1.6.   07/25/2022: Glucose 102, BUN/Cr 33/1.6. EGFR NA. Na/K 141/3.7.  H/H 17/52.   07/10/2022: Glucose 95, BUN/Cr 16/1.14. EGFR 53. Na/K 139/4.3 NT-proBNP 1182  03/25/2019: Glucose 97, BUN/Cr 22/0.9. EGFR 59. Na/K 142/4.2.  H/H 16/49. MCV 91. Platelets 429 Chol 97, TG 123, HDL 26, LDL 46 TSH 1.6 normal   Review of Systems  Cardiovascular:  Negative for chest pain, dyspnea on exertion, leg swelling, palpitations and syncope.        Vitals:   04/05/23 0844 04/05/23 0846  BP: (!) 148/98 135/84  Pulse: 86   Resp: 16   SpO2: 95%       Body mass index is 45.26 kg/m. Filed Weights   04/05/23 0844  Weight: 289 lb (131.1 kg)      Objective:   Physical Exam Vitals and nursing note reviewed.  Constitutional:      General: She is not in acute distress. Neck:     Vascular: No JVD.  Cardiovascular:     Rate and Rhythm: Normal rate. Rhythm irregular.     Heart sounds: Normal heart sounds. No murmur heard. Pulmonary:     Effort: Pulmonary effort is normal.     Breath sounds: Normal breath sounds. No wheezing or rales.  Musculoskeletal:     Right lower leg: No edema.     Left lower leg: No edema.           Assessment & Recommendations:   67 y.o. Caucasian female, with obesity former smoker, hypertension, persistent Afib  Chronic HFpEF: Fairly compensated today with no significant edema. Continue Jardiance.  Use Lasix only as needed.  Permanent Afib:  Unsuccessful cardioversion attempt 07/2022.  At this point, I would deem her Afib  as permanent.  Continue metoprolol tartrate 100 mg twice daily, diltiazem 360 mg daily. CHA2DS2VAsc score. Continue eliquis 5 mg bid.   Hypertension: Controlled.  We discussed weight loss with diet and lifestyle modification. She is not interested in medications at this time.    F/u in 1 year    Elder Negus, MD Pager: [phone] Office: [phone]

## 2023-04-11 ENCOUNTER — Ambulatory Visit: Payer: Medicare Other | Admitting: Cardiology

## 2023-10-25 ENCOUNTER — Other Ambulatory Visit: Payer: Self-pay | Admitting: Cardiology

## 2023-10-25 ENCOUNTER — Telehealth: Payer: Self-pay | Admitting: Cardiology

## 2023-10-25 DIAGNOSIS — I4811 Longstanding persistent atrial fibrillation: Secondary | ICD-10-CM

## 2023-10-25 DIAGNOSIS — I1A0 Resistant hypertension: Secondary | ICD-10-CM

## 2023-10-25 MED ORDER — ISOSORBIDE DINITRATE 30 MG PO TABS
30.0000 mg | ORAL_TABLET | Freq: Three times a day (TID) | ORAL | 1 refills | Status: DC
Start: 2023-10-25 — End: 2024-04-08

## 2023-10-25 MED ORDER — HYDRALAZINE HCL 50 MG PO TABS: 50.0000 mg | ORAL_TABLET | Freq: Three times a day (TID) | ORAL | 1 refills | Status: DC

## 2023-10-25 NOTE — Telephone Encounter (Signed)
 Pt's medication was sent to pt's pharmacy as requested. Confirmation received.

## 2023-10-25 NOTE — Telephone Encounter (Signed)
*  STAT* If patient is at the pharmacy, call can be transferred to refill team.   1. Which medications need to be refilled? (please list name of each medication and dose if known)   hydrALAZINE (APRESOLINE) 50 MG tablet    isosorbide dinitrate (ISORDIL) 30 MG tablet   2. Which pharmacy/location (including street and city if local pharmacy) is medication to be sent to? [facility] [address] [place], [place] - [address] [address] [phone] [address] Phone: [phone]  Fax: [phone]     3. Do they need a 30 day or 90 day supply? 90  Please refill asap pt has been trying to get them refilled but they were thru [facility].

## 2023-12-03 DIAGNOSIS — Z1231 Encounter for screening mammogram for malignant neoplasm of breast: Secondary | ICD-10-CM | POA: Diagnosis not present

## 2024-04-01 ENCOUNTER — Other Ambulatory Visit: Payer: Self-pay | Admitting: Cardiology

## 2024-04-01 DIAGNOSIS — I5032 Chronic diastolic (congestive) heart failure: Secondary | ICD-10-CM

## 2024-04-08 ENCOUNTER — Encounter: Payer: Self-pay | Admitting: Cardiology

## 2024-04-08 ENCOUNTER — Ambulatory Visit: Attending: Cardiology | Admitting: Cardiology

## 2024-04-08 VITALS — BP 120/78 | HR 78 | Ht 67.0 in | Wt 293.0 lb

## 2024-04-08 DIAGNOSIS — I4821 Permanent atrial fibrillation: Secondary | ICD-10-CM | POA: Diagnosis not present

## 2024-04-08 DIAGNOSIS — I4811 Longstanding persistent atrial fibrillation: Secondary | ICD-10-CM

## 2024-04-08 DIAGNOSIS — I10 Essential (primary) hypertension: Secondary | ICD-10-CM

## 2024-04-08 DIAGNOSIS — E66813 Obesity, class 3: Secondary | ICD-10-CM | POA: Diagnosis not present

## 2024-04-08 DIAGNOSIS — Z13220 Encounter for screening for lipoid disorders: Secondary | ICD-10-CM | POA: Diagnosis not present

## 2024-04-08 DIAGNOSIS — I5032 Chronic diastolic (congestive) heart failure: Secondary | ICD-10-CM | POA: Diagnosis not present

## 2024-04-08 DIAGNOSIS — Z6841 Body Mass Index (BMI) 40.0 and over, adult: Secondary | ICD-10-CM

## 2024-04-08 MED ORDER — FUROSEMIDE 40 MG PO TABS: 40.0000 mg | ORAL_TABLET | ORAL | 1 refills | Status: AC | PRN

## 2024-04-08 MED ORDER — METOPROLOL TARTRATE 100 MG PO TABS: 100.0000 mg | ORAL_TABLET | Freq: Two times a day (BID) | ORAL | 3 refills | Status: AC

## 2024-04-08 MED ORDER — ISOSORBIDE DINITRATE 30 MG PO TABS: 30.0000 mg | ORAL_TABLET | Freq: Three times a day (TID) | ORAL | 3 refills | Status: AC

## 2024-04-08 MED ORDER — HYDRALAZINE HCL 50 MG PO TABS: 50.0000 mg | ORAL_TABLET | Freq: Three times a day (TID) | ORAL | 3 refills | Status: AC

## 2024-04-08 MED ORDER — APIXABAN 5 MG PO TABS: 5.0000 mg | ORAL_TABLET | Freq: Two times a day (BID) | ORAL | 3 refills | Status: AC

## 2024-04-08 MED ORDER — DILTIAZEM HCL ER COATED BEADS 360 MG PO CP24
360.0000 mg | ORAL_CAPSULE | Freq: Every day | ORAL | 3 refills | Status: AC
Start: 2024-04-08 — End: ?

## 2024-04-08 MED ORDER — LOSARTAN POTASSIUM-HCTZ 50-12.5 MG PO TABS: 1.0000 | ORAL_TABLET | ORAL | 3 refills | Status: AC

## 2024-04-08 NOTE — Patient Instructions (Signed)
 Medication Instructions:  STOP Jardiance    Refilled today *If you need a refill on your cardiac medications before your next appointment, please call your pharmacy*  Lab Work: Lipid panel  CBC BMP  If you have labs (blood work) drawn today and your tests are completely normal, you will receive your results only by: MyChart Message (if you have MyChart) OR A paper copy in the mail If you have any lab test that is abnormal or we need to change your treatment, we will call you to review the results.  Testing/Procedures: ECHOCARDIOGRAM  Your physician has requested that you have an echocardiogram. Echocardiography is a painless test that uses sound waves to create images of your heart. It provides your doctor with information about the size and shape of your heart and how well your heart's chambers and valves are working. This procedure takes approximately one hour. There are no restrictions for this procedure. Please do NOT wear cologne, perfume, aftershave, or lotions (deodorant is allowed). Please arrive 15 minutes prior to your appointment time.  Please note: We ask at that you not bring children with you during ultrasound (echo/ vascular) testing. Due to room size and safety concerns, children are not allowed in the ultrasound rooms during exams. Our front office staff cannot provide observation of children in our lobby area while testing is being conducted. An adult accompanying a patient to their appointment will only be allowed in the ultrasound room at the discretion of the ultrasound technician under special circumstances. We apologize for any inconvenience.  REFERRAL TO PHARM D    Follow-Up: At [facility], you and your health needs are our priority.  As part of our continuing mission to provide you with exceptional heart care, our providers are all part of one team.  This team includes your primary Cardiologist (physician) and Advanced Practice Providers or APPs  (Physician Assistants and Nurse Practitioners) who all work together to provide you with the care you need, when you need it.  Your next appointment:   1 year(s)  Provider:   Newman JINNY Lawrence, MD

## 2024-04-08 NOTE — Progress Notes (Signed)
 Cardiology Office Note:  .   Date:  04/08/2024  ID:  Sharon Acosta, DOB 1955/10/15, MRN [id number] PCP: Barbra Odor, NP  [facility] Providers Cardiologist:  Newman Lawrence, MD PCP: Barbra Odor, NP  Chief Complaint  Patient presents with   Hypertension   Atrial Fibrillation   Shortness of Breath     Sharon Acosta is a 68 y.o. female with hypertension, obesity, permanent A-fib,   Discussed the use of AI scribe software for clinical note transcription with the patient, who gave verbal consent to proceed.  History of Present Illness  Patient lost her father since her last visit with me. She has not been able to lose as much weight as she would have liked to. She reports exertional dyspnea, but denies any chest pain, leg swelling, orthopnea, PND. She has had occasional burning sensation in urination while on Jardiance .   Vitals:   04/08/24 0925  BP: 120/78  Pulse: 78  SpO2: 95%      Review of Systems  Cardiovascular:  Positive for dyspnea on exertion. Negative for chest pain, leg swelling, palpitations and syncope.        Studies Reviewed: .        Echocardiogram 06/2022:  Study Quality: Technically difficult study.  Normal LV systolic function with visual EF 55-60%. Left ventricle cavity  is normal in size. Normal left ventricular wall thickness. Normal global  wall motion. Unable to evaluate diastolic function due to atrial  fibrillation.  Right atrial cavity is dilated.  Right ventricle cavity is normal in size. Mildly reduced right ventricular  function.  Mild tricuspid regurgitation. Mild pulmonary hypertension. RVSP measures  39 mmHg.  IVC is dilated with a respiratory response of >50%, estimated RAP mmHg.  The aortic root is dilated (Sinus of Valsalva 40 mm, Sinotubular junction  39 mm) Mildly dilated proximal ascending aorta at 39 mm.  Compared to 06/07/2020 right heart findings appear to be new, mild PHTN is  new, dilatation of aorta  new finding, otherwise no significant change.   Risk Assessment/Calculations:    CHA2DS2-VASc Score = 4  This indicates a 4.8% annual risk of stroke. The patient's score is based upon: CHF History: 1 HTN History: 1 Diabetes History: 0 Stroke History: 0 Vascular Disease History: 0 Age Score: 1 Gender Score: 1     Physical Exam Vitals and nursing note reviewed.  Constitutional:      General: She is not in acute distress.    Appearance: She is obese.  Neck:     Vascular: No JVD.  Cardiovascular:     Rate and Rhythm: Normal rate. Rhythm irregular.     Heart sounds: Normal heart sounds. No murmur heard. Pulmonary:     Effort: Pulmonary effort is normal.     Breath sounds: Normal breath sounds. No wheezing or rales.  Musculoskeletal:     Right lower leg: No edema.     Left lower leg: No edema.      VISIT DIAGNOSES:   ICD-10-CM   1. Permanent atrial fibrillation (HCC)  I48.21 ECHOCARDIOGRAM COMPLETE    AMB Referral to [facility]    CBC    Basic metabolic panel with GFR    CANCELED: EKG 12-Lead    2. Chronic heart failure with preserved ejection fraction (HCC)  I50.32 furosemide  (LASIX ) 40 MG tablet    3. Essential hypertension  I10 CANCELED: EKG 12-Lead    4. Longstanding persistent atrial fibrillation (HCC)  I48.11 apixaban  (ELIQUIS ) 5 MG TABS tablet  diltiazem  (CARDIZEM  CD) 360 MG 24 hr capsule    hydrALAZINE  (APRESOLINE ) 50 MG tablet    isosorbide  dinitrate (ISORDIL ) 30 MG tablet    losartan -hydrochlorothiazide (HYZAAR) 50-12.5 MG tablet    metoprolol  tartrate (LOPRESSOR ) 100 MG tablet    5. Class 3 severe obesity with body mass index (BMI) of 45.0 to 49.9 in adult, unspecified obesity type, unspecified whether serious comorbidity present  E66.813 AMB Referral to [facility] Pharm-D   Z68.42     6. Screening, lipid  Z13.220 Lipid panel       Sharon Acosta is a 68 y.o. female with hypertension, obesity, permanent A-fib, HFpEF Assessment &  Plan  Chronic HFpEF: Fairly compensated today with no significant edema. Concern for UTI's with Jardiance , therefore stopped today. Dyspnea more likely due to obesity. Check echocardiogram today.  Permanent Afib:  Continue metoprolol  tartrate 100 mg twice daily, diltiazem  360 mg daily. Continue eliquis  5 mg bid.  Check CBC, BMP  Hypertension: Controlled. Refilled meds.  Obesity: With her Afib, I think she will benefit from weight loss. Refer to pharmD to consider weight loss medications.   Check screening lipid panel.      Meds ordered this encounter  Medications   apixaban  (ELIQUIS ) 5 MG TABS tablet    Sig: Take 1 tablet (5 mg total) by mouth 2 (two) times daily.    Dispense:  180 tablet    Refill:  3   diltiazem  (CARDIZEM  CD) 360 MG 24 hr capsule    Sig: Take 1 capsule (360 mg total) by mouth daily.    Dispense:  90 capsule    Refill:  3   furosemide  (LASIX ) 40 MG tablet    Sig: Take 1 tablet (40 mg total) by mouth as needed.    Dispense:  90 tablet    Refill:  1   hydrALAZINE  (APRESOLINE ) 50 MG tablet    Sig: Take 1 tablet (50 mg total) by mouth 3 (three) times daily.    Dispense:  270 tablet    Refill:  3   isosorbide  dinitrate (ISORDIL ) 30 MG tablet    Sig: Take 1 tablet (30 mg total) by mouth 3 (three) times daily.    Dispense:  270 tablet    Refill:  3   losartan -hydrochlorothiazide (HYZAAR) 50-12.5 MG tablet    Sig: Take 1 tablet by mouth every morning.    Dispense:  90 tablet    Refill:  3   metoprolol  tartrate (LOPRESSOR ) 100 MG tablet    Sig: Take 1 tablet (100 mg total) by mouth 2 (two) times daily.    Dispense:  180 tablet    Refill:  3     F/u in 1 year  Signed, Newman JINNY Lawrence, MD

## 2024-04-09 ENCOUNTER — Ambulatory Visit: Payer: Self-pay | Admitting: Cardiology

## 2024-04-09 DIAGNOSIS — I10 Essential (primary) hypertension: Secondary | ICD-10-CM

## 2024-04-09 DIAGNOSIS — I77819 Aortic ectasia, unspecified site: Secondary | ICD-10-CM

## 2024-04-09 LAB — BASIC METABOLIC PANEL WITH GFR
BUN/Creatinine Ratio: 18 (ref 12–28)
BUN: 23 mg/dL (ref 8–27)
CO2: 20 mmol/L (ref 20–29)
Calcium: 9.2 mg/dL (ref 8.7–10.3)
Chloride: 100 mmol/L (ref 96–106)
Creatinine, Ser: 1.29 mg/dL — ABNORMAL HIGH (ref 0.57–1.00)
Glucose: 98 mg/dL (ref 70–99)
Potassium: 4.4 mmol/L (ref 3.5–5.2)
Sodium: 139 mmol/L (ref 134–144)
eGFR: 45 mL/min/1.73 — ABNORMAL LOW (ref >59)

## 2024-04-09 LAB — CBC
Hematocrit: 53 % — ABNORMAL HIGH (ref 34.0–46.6)
Hemoglobin: 17.3 g/dL — ABNORMAL HIGH (ref 11.1–15.9)
MCH: 30.6 pg (ref 26.6–33.0)
MCHC: 32.6 g/dL (ref 31.5–35.7)
MCV: 94 fL (ref 79–97)
Platelets: 280 x10E3/uL (ref 150–450)
RBC: 5.65 x10E6/uL — ABNORMAL HIGH (ref 3.77–5.28)
RDW: 13.8 % (ref 11.7–15.4)
WBC: 9.3 x10E3/uL (ref 3.4–10.8)

## 2024-04-09 LAB — LIPID PANEL
Chol/HDL Ratio: 3.3 ratio (ref 0.0–4.4)
Cholesterol, Total: 130 mg/dL (ref 100–199)
HDL: 39 mg/dL — ABNORMAL LOW (ref >39)
LDL Chol Calc (NIH): 65 mg/dL (ref 0–99)
Triglycerides: 151 mg/dL — ABNORMAL HIGH (ref 0–149)
VLDL Cholesterol Cal: 26 mg/dL (ref 5–40)

## 2024-04-20 DIAGNOSIS — Z23 Encounter for immunization: Secondary | ICD-10-CM | POA: Diagnosis not present

## 2024-04-20 DIAGNOSIS — I509 Heart failure, unspecified: Secondary | ICD-10-CM | POA: Diagnosis not present

## 2024-04-20 DIAGNOSIS — I10 Essential (primary) hypertension: Secondary | ICD-10-CM | POA: Diagnosis not present

## 2024-04-20 DIAGNOSIS — E669 Obesity, unspecified: Secondary | ICD-10-CM | POA: Diagnosis not present

## 2024-04-20 DIAGNOSIS — Z0000 Encounter for general adult medical examination without abnormal findings: Secondary | ICD-10-CM | POA: Diagnosis not present

## 2024-04-30 DIAGNOSIS — Z1212 Encounter for screening for malignant neoplasm of rectum: Secondary | ICD-10-CM | POA: Diagnosis not present

## 2024-05-05 LAB — COLOGUARD: COLOGUARD: POSITIVE — AB

## 2024-05-07 ENCOUNTER — Ambulatory Visit: Admitting: Pharmacist Clinician (PhC)/ Clinical Pharmacy Specialist

## 2024-05-07 ENCOUNTER — Ambulatory Visit
Admission: RE | Admit: 2024-05-07 | Discharge: 2024-05-07 | Disposition: A | Discharge status: Home / Self Care | Source: Ambulatory Visit | Attending: Cardiology | Admitting: Cardiology

## 2024-05-07 DIAGNOSIS — I4821 Permanent atrial fibrillation: Secondary | ICD-10-CM | POA: Insufficient documentation

## 2024-05-07 DIAGNOSIS — R0609 Other forms of dyspnea: Secondary | ICD-10-CM | POA: Diagnosis not present

## 2024-05-07 LAB — ECHOCARDIOGRAM COMPLETE
AR max vel: 2.47 cm2
AV Area VTI: 2.41 cm2
AV Area mean vel: 2.02 cm2
AV Mean grad: 4 mmHg
AV Peak grad: 6.5 mmHg
Ao pk vel: 1.27 m/s
Area-P 1/2: 5.06 cm2
S' Lateral: 2.4 cm

## 2024-05-07 NOTE — Progress Notes (Deleted)
 Office Visit    Patient Name: Sharon Acosta Date of Encounter: 05/07/2024  Primary Care Provider:  Barbra Odor, NP Primary Cardiologist:  Newman JINNY Lawrence, MD  Chief Complaint    Weight management  Significant Past Medical History                    No Known Allergies  History of Present Illness    Sharon Acosta is a 68 y.o. female patient of Dr ***, in the office today to discuss options for weight management.    *** If diabetic and on insulin/sulfonylurea, can consider reducing dose to reduce risk of hypoglycemia  *** Follow-up visit  Assess % weight loss Assess adverse effects Missed doses  Current weight management medications:   Previously tried meds:   Current meds that may affect weight:   Baseline weight/BMI:   Insurance payor:   Diet:   Exercise:   Family History:   Confirmed patient not ***pregnant and no personal or family history of medullary thyroid carcinoma (MTC) or Multiple Endocrine Neoplasia syndrome type 2 (MEN 2).   Social History:   Tobacco:  Alcohol:  Caffeine:   Adherence Assessment  Do you ever forget to take your medication? [] Yes [] No  Do you ever skip doses due to side effects? [] Yes [] No  Do you have trouble affording your medicines? [] Yes [] No  Are you ever unable to pick up your medication due to transportation difficulties? [] Yes [] No  Do you ever stop taking your medications because you don't believe they are helping? [] Yes [] No  Do you check your weight daily? [] Yes [] No   Adherence strategy: ***  Barriers to obtaining medications: ***   Accessory Clinical Findings    Lab Results  Component Value Date   CREATININE 1.29 (H) 04/08/2024   BUN 23 04/08/2024   NA 139 04/08/2024   K 4.4 04/08/2024   CL 100 04/08/2024   CO2 20 04/08/2024   Lab Results  Component Value Date   ALT 28 10/16/2022   AST 21 10/16/2022   ALKPHOS 82 10/16/2022   BILITOT 0.6 10/16/2022   Lab Results   Component Value Date   HGBA1C 5.4 02/13/2019      Home Medications/Allergies    Current Outpatient Medications  Medication Sig Dispense Refill   apixaban  (ELIQUIS ) 5 MG TABS tablet Take 1 tablet (5 mg total) by mouth 2 (two) times daily. 180 tablet 3   buPROPion (WELLBUTRIN XL) 300 MG 24 hr tablet Take 300 mg by mouth every morning.     Cholecalciferol (VITAMIN D3) 125 MCG (5000 UT) CAPS Take by mouth daily.     diltiazem  (CARDIZEM  CD) 360 MG 24 hr capsule Take 1 capsule (360 mg total) by mouth daily. 90 capsule 3   furosemide  (LASIX ) 40 MG tablet Take 1 tablet (40 mg total) by mouth as needed. 90 tablet 1   hydrALAZINE  (APRESOLINE ) 50 MG tablet Take 1 tablet (50 mg total) by mouth 3 (three) times daily. 270 tablet 3   isosorbide  dinitrate (ISORDIL ) 30 MG tablet Take 1 tablet (30 mg total) by mouth 3 (three) times daily. 270 tablet 3   losartan -hydrochlorothiazide (HYZAAR) 50-12.5 MG tablet Take 1 tablet by mouth every morning. 90 tablet 3   Magnesium  250 MG TABS Take 250 mg by mouth daily with breakfast.     metoprolol  tartrate (LOPRESSOR ) 100 MG tablet Take 1 tablet (100 mg total) by mouth 2 (two) times daily. 180 tablet 3   POTASSIUM CHLORIDE   ER PO Take 1 tablet by mouth as needed (takes with lasix  as needed).     pyridOXINE (VITAMIN B-6) 100 MG tablet Take 100 mg by mouth daily with breakfast.     No current facility-administered medications for this visit.     No Known Allergies  Assessment & Plan    No problem-specific Assessment & Plan notes found for this encounter.   Sharon Acosta PharmD CPP [facility]  [address] [place], [place] [address] [phone]

## 2024-05-08 NOTE — Progress Notes (Signed)
 Heart function looks normal. Aorta is dilated at 45 mm (normal <38 mm). Unlikely that any surgery will be required anytime soon (and hopefully never), but recommend further evaluation with CTA aorta for more accurate evaluation. Happy to see her after the CTA to discuss the results.  Thanks MJP

## 2024-05-11 DIAGNOSIS — H524 Presbyopia: Secondary | ICD-10-CM | POA: Diagnosis not present

## 2024-05-12 DIAGNOSIS — Z87891 Personal history of nicotine dependence: Secondary | ICD-10-CM | POA: Diagnosis not present

## 2024-05-19 NOTE — Telephone Encounter (Signed)
 Pt requesting a c/b from the nurse to discuss results.

## 2024-05-25 DIAGNOSIS — I77819 Aortic ectasia, unspecified site: Secondary | ICD-10-CM | POA: Diagnosis not present

## 2024-05-25 DIAGNOSIS — I10 Essential (primary) hypertension: Secondary | ICD-10-CM | POA: Diagnosis not present

## 2024-05-26 ENCOUNTER — Ambulatory Visit (HOSPITAL_COMMUNITY)
Admission: RE | Admit: 2024-05-26 | Discharge: 2024-05-26 | Disposition: A | Discharge status: Home / Self Care | Source: Ambulatory Visit | Attending: Cardiology | Admitting: Cardiology

## 2024-05-26 DIAGNOSIS — I77819 Aortic ectasia, unspecified site: Secondary | ICD-10-CM | POA: Diagnosis not present

## 2024-05-26 DIAGNOSIS — I77810 Thoracic aortic ectasia: Secondary | ICD-10-CM | POA: Diagnosis not present

## 2024-05-26 DIAGNOSIS — I517 Cardiomegaly: Secondary | ICD-10-CM | POA: Diagnosis not present

## 2024-05-26 DIAGNOSIS — I700 Atherosclerosis of aorta: Secondary | ICD-10-CM | POA: Diagnosis not present

## 2024-05-26 LAB — BASIC METABOLIC PANEL WITH GFR
BUN/Creatinine Ratio: 17 (ref 12–28)
BUN: 19 mg/dL (ref 8–27)
CO2: 22 mmol/L (ref 20–29)
Calcium: 9.2 mg/dL (ref 8.7–10.3)
Chloride: 96 mmol/L (ref 96–106)
Creatinine, Ser: 1.15 mg/dL — ABNORMAL HIGH (ref 0.57–1.00)
Glucose: 82 mg/dL (ref 70–99)
Potassium: 4 mmol/L (ref 3.5–5.2)
Sodium: 135 mmol/L (ref 134–144)
eGFR: 52 mL/min/1.73 — ABNORMAL LOW (ref >59)

## 2024-05-26 MED ORDER — IOHEXOL 350 MG/ML SOLN
100.0000 mL | Freq: Once | INTRAVENOUS | Status: AC | PRN
  Administered 2024-05-26: 100 mL via INTRAVENOUS

## 2024-05-28 ENCOUNTER — Telehealth: Payer: Self-pay | Admitting: Cardiology

## 2024-05-28 DIAGNOSIS — R400 Somnolence: Secondary | ICD-10-CM

## 2024-05-28 DIAGNOSIS — I77819 Aortic ectasia, unspecified site: Secondary | ICD-10-CM

## 2024-05-28 NOTE — Telephone Encounter (Signed)
 Chest CT

## 2024-05-28 NOTE — Telephone Encounter (Signed)
 Spoke with the patient and advised that Dr. Elmira has not reviewed results of her CT scan yet. Advised that she will be contacted once they are reviewed with his recommendations.

## 2024-05-28 NOTE — Telephone Encounter (Signed)
Pt calling to f/u on results. Please advise 

## 2024-05-29 NOTE — Telephone Encounter (Signed)
 Spoke to patient she stated she had a chest ct 11/4.Stated she is concerned and would like to know results.Advised results unavailable.I will send message to Dr.Patwardhan.

## 2024-05-29 NOTE — Telephone Encounter (Signed)
 Pt called to FU and would like a c/b before the day ends. Please advise

## 2024-05-29 NOTE — Progress Notes (Signed)
 Kidney function has improved, about at baseline.  Thanks MJP

## 2024-05-31 ENCOUNTER — Encounter: Payer: Self-pay | Admitting: Cardiology

## 2024-06-01 NOTE — Progress Notes (Signed)
 Please see separate message from earlier today regarding CT scan results.  Thanks MJP

## 2024-06-01 NOTE — Addendum Note (Signed)
 Addended by: MANDA BOTTCHER B on: 06/01/2024 03:43 PM   Modules accepted: Orders

## 2024-06-01 NOTE — Telephone Encounter (Signed)
 Pt contacted and advised. Pt is very anxious. Pt agrees with plan of care. Referral has been placed. Pt would like to know if there is a way that she can get approved for weight loss medication. Pt reports that she had appointment with pharm-d and then it was canceled because she was advised that insurance would not approve medication. Pt reports that she contacted her insurance and was advised that she could possibly be a exception.

## 2024-06-01 NOTE — Telephone Encounter (Signed)
 Apologize for the confusion. Recent CT angiogram aorta did show dilation of ascending aorta at 4.5 cm.  This is somewhat similar to noted on recent echocardiogram.  Otherwise, shows mild calcification of heart arteries.    Heart rate and blood pressure control and avoiding lifting heavy objects greater than sign 40 pounds would be recommended.  Large aorta is >5.5 cm require repair to reduce risk of rupture.  There is no indication for surgery at this time, and hopefully never, but would be helpful to establish care with cardiothoracic surgery in case any repair of the aorta would be needed in the future.  Thanks MJP

## 2024-06-01 NOTE — Telephone Encounter (Signed)
 Please review separate telephone encounter.

## 2024-06-01 NOTE — Telephone Encounter (Signed)
 Reviewed with patient.  She does not meet the current qualifications for medication.   She understands, and will reach out again should she hear of changes to medication qualifications.

## 2024-06-02 NOTE — Telephone Encounter (Signed)
 She has coronary artery disease on CT scan and persistent A-fib.  Would that qualify her for any of the GLP-1 agonists?  Thanks MJP

## 2024-06-04 ENCOUNTER — Ambulatory Visit

## 2024-06-04 ENCOUNTER — Encounter

## 2024-06-04 VITALS — BP 118/82 | HR 108 | Resp 20 | Ht 67.0 in | Wt 293.0 lb

## 2024-06-04 DIAGNOSIS — I7121 Aneurysm of the ascending aorta, without rupture: Secondary | ICD-10-CM

## 2024-06-04 NOTE — Patient Instructions (Addendum)
 Risk Modification in those with ascending thoracic aortic aneurysm:   Continue control of blood pressure (prefer BP 130/80 or less)   2. Avoid fluoroquinolone antibiotics (I.e Ciprofloxacin, Avelox, Levofloxacin, Ofloxacin)   3.  Use of statin (to decrease cardiovascular risk)   4.  Exercise and activity limitations is individualized no lifting over 50 pounds. Contact sports are to be avoided and exercises involving sustained Valsalva maneuver.   5.  Follow-up in 6 months with CTA chest.

## 2024-06-04 NOTE — Progress Notes (Addendum)
 [address] [place] [address]             [phone]            Sharon Acosta [id number] 05-29-1956   History of Present Illness:  Sharon Acosta is a 68 year old female with medical history of hypertension, permanent atrial fibrillation, chronic heart failure with preserved ejection fraction, pulmonary hypertension, and former smoker who presents for initial encounter of ascending thoracic aortic aneurysm.  On recent CTA of chest aneurysm measured 4.5 cm.  Echocardiogram on 04/2024 showed tricuspid aortic valve. Aortic valve regurgitation is not visualized. Aortic valve sclerosis/calcification is present, without any evidence of aortic stenosis.   She presents to the clinic today and reports that she has been doing ok.  Her blood pressure is well controlled with current medication therapy.  She checks her BP at home and it runs 110s-120s/80s.  She does have shortness of breath with exertion and this has not worsened.  She has gained weight recently and is motivated to try and lose weight.  She currently does not exercise and denies heavy lifting. She denies chest pain and lower leg edema.   No family history of aneurysmal disease. No personal or family history of connective tissue disorders   Current Outpatient Medications on File Prior to Visit  Medication Sig Dispense Refill   apixaban  (ELIQUIS ) 5 MG TABS tablet Take 1 tablet (5 mg total) by mouth 2 (two) times daily. 180 tablet 3   buPROPion (WELLBUTRIN XL) 300 MG 24 hr tablet Take 300 mg by mouth every morning.     Cholecalciferol (VITAMIN D3) 125 MCG (5000 UT) CAPS Take by mouth daily.     diltiazem  (CARDIZEM  CD) 360 MG 24 hr capsule Take 1 capsule (360 mg total) by mouth daily. 90 capsule 3   furosemide  (LASIX ) 40 MG tablet Take 1 tablet (40 mg total) by mouth as needed. 90 tablet 1   hydrALAZINE  (APRESOLINE ) 50 MG tablet Take 1 tablet (50 mg total) by mouth 3 (three) times daily. 270 tablet 3    isosorbide  dinitrate (ISORDIL ) 30 MG tablet Take 1 tablet (30 mg total) by mouth 3 (three) times daily. 270 tablet 3   losartan -hydrochlorothiazide (HYZAAR) 50-12.5 MG tablet Take 1 tablet by mouth every morning. 90 tablet 3   Magnesium  250 MG TABS Take 250 mg by mouth daily with breakfast.     metoprolol  tartrate (LOPRESSOR ) 100 MG tablet Take 1 tablet (100 mg total) by mouth 2 (two) times daily. 180 tablet 3   POTASSIUM CHLORIDE  ER PO Take 1 tablet by mouth as needed (takes with lasix  as needed).     pyridOXINE (VITAMIN B-6) 100 MG tablet Take 100 mg by mouth daily with breakfast.     No current facility-administered medications on file prior to visit.     ROS: Review of Systems  Respiratory:  Positive for shortness of breath. Negative for cough.        Shortness of breath on exertion  Cardiovascular:  Negative for chest pain and leg swelling.     BP 118/82   Pulse (!) 108   Resp 20   Ht 5' 7 (1.702 m)   Wt 293 lb (132.9 kg)   SpO2 92% Comment: RA  BMI 45.89 kg/m   Physical Exam Constitutional:      Appearance: Normal appearance.  HENT:     Head: Normocephalic and atraumatic.  Musculoskeletal:  Cervical back: Normal range of motion.  Skin:    General: Skin is warm and dry.  Neurological:     General: No focal deficit present.     Mental Status: She is alert.      Imaging: EXAM: CTA CHEST AORTA 05/26/2024 02:05:20 PM   TECHNIQUE: CTA of the chest was performed after the administration of 75 mL of Omnipaque  350. Multiplanar reformatted images are provided for review. MIP images are provided for review. Automated exposure control, iterative reconstruction, and/or weight based adjustment of the mA/kV was utilized to reduce the radiation dose to as low as reasonably achievable.   CONTRAST: 75 mL of Omnipaque  350.   COMPARISON: Comparison to study 02/12/2019.   CLINICAL HISTORY: Dilated aorta.   FINDINGS:   AORTA: The ascending aorta is dilated,  measuring up to 4.5 cm. The descending thoracic aorta is normal in size. Origin of the great vessels appears within normal limits. There is no evidence for dissection. There is mild calcified atherosclerotic disease of the aorta and iliac arteries.   MEDIASTINUM: The main pulmonary artery is enlarged. The heart is mildly enlarged. No mediastinal lymphadenopathy. The pericardium demonstrate no acute abnormality.   LYMPH NODES: No mediastinal, hilar or axillary lymphadenopathy.   LUNGS AND PLEURA: There is a linear area of likely scarring or atelectasis in the lateral left upper lobe. There is a stable 2 mm nodule in the right middle lobe which is unchanged from 2010 compatible with benign etiology. There is a stable 3 mm nodule in the right lower lobe image 302/55 which is unchanged from 2020 compatible with benign etiology. There is a new 2 mm visual nodule in the right upper lobe image 302/42. The lungs are otherwise without acute process. No focal consolidation or pulmonary edema. No pleural effusion or pneumothorax. There is no central pulmonary embolism.   UPPER ABDOMEN: Gallstones are likely present. Limited images of the upper abdomen are otherwise unremarkable.   SOFT TISSUES AND BONES: No acute bone or soft tissue abnormality.   IMPRESSION: 1. Dilated aorta measuring up to 4.5 cm near the origin of the great vessels, with mild calcified atherosclerotic disease.  Recommend semi-annual imaging followup by CTA or MRA and referral to cardiothoracic surgery if not already obtained.  This recommendation follows 2010 ACCF/AHA/AATS/ACR/ASA/SCA/SCAI/SIR/STS/SVM Guidelines for the Diagnosis and Management of Patients With Thoracic Aortic Disease.  Circulation. 2010; 121: Z733-z630. Aortic aneurysm NOS (ICD10-I71.9) 2. Enlarged main pulmonary artery. No central pulmonary embolism. 3. Mild cardiomegaly. 4. New 2 mm right upper lobe pulmonary nodule; per Fleischner  Society Guidelines, no routine follow-up imaging is recommended. 5. Likely cholelithiasis.   Electronically signed by: Greig Pique MD 05/31/2024 10:16 PM EST RP Workstation: [id number]     A/P:  Aneurysm of ascending aorta without rupture -4.5 cm ascending thoracic aortic aneurysm on CTA of chest. Echocardiogram showed tricuspid aortic valve.   -We discussed the natural history and and risk factors for growth of ascending aortic aneurysms. Discussed recommendations to minimize the risk of further expansion or dissection including careful blood pressure control, avoidance of contact sports and heavy lifting, attention to lipid management.  We covered the importance of continued smoking cessation.  The patient does not yet meet surgical criteria of >5.5cm. The patient is aware of signs and symptoms of aortic dissection and when to present to the emergency department   -Follow up in 6 months with CTA of chest for continued surveillance   Pulmonary Nodules -new 2 mm right upper lobe nodule -other  nodules stable in size  -will continue to follow with CT scans for aneurysm   Risk Modification:  Statin:  not currently prescribed  Smoking cessation instruction/counseling given:  commended patient for quitting and reviewed strategies for preventing relapses  Patient was counseled on importance of Blood Pressure Control  They are instructed to contact their Primary Care Physician if they start to have blood pressure readings over 130s/90s. Do not ever stop blood pressure medications on your own, unless instructed by healthcare professional.  Please avoid use of Fluoroquinolones as this can potentially increase your risk of Aortic Rupture and/or Dissection  Patient educated on signs and symptoms of Aortic Dissection, handout also provided in AVS  Manuelita CHRISTELLA Rough, PA-C 06/04/24

## 2024-06-09 NOTE — Telephone Encounter (Signed)
 Sharon Acosta, when you get a chance, please let hte patient know. I do not recollect if she has OSA, but she could have a sleep study if not previouslty diagnosed.  Thanks MJP

## 2024-06-10 NOTE — Telephone Encounter (Signed)
 Spoke with patient and she reports that she is. She does not have a diagnosis of sleep apnea. Order placed for Itamar.

## 2024-06-10 NOTE — Addendum Note (Signed)
 Addended by: MANDA BOTTCHER B on: 06/10/2024 12:33 PM   Modules accepted: Orders

## 2024-06-23 ENCOUNTER — Encounter: Payer: Self-pay | Admitting: Cardiology

## 2024-06-25 ENCOUNTER — Other Ambulatory Visit: Payer: Self-pay

## 2024-06-25 NOTE — Telephone Encounter (Signed)
**Note De-Identified Roselee Tayloe Obfuscation** I called the pt and we answered the [facility] Questionnaire. I advised her that I am sending her Itamar-HST order to the [facility] company and that they will do a PA through her insurance plan and will be communicating with her Shafer Swamy text messages through her cell phone.  She verbalized understanding and thanked me for my call.

## 2024-07-08 ENCOUNTER — Encounter: Payer: Self-pay | Admitting: Cardiology

## 2024-07-08 DIAGNOSIS — E669 Obesity, unspecified: Secondary | ICD-10-CM | POA: Diagnosis not present

## 2024-07-08 DIAGNOSIS — R030 Elevated blood-pressure reading, without diagnosis of hypertension: Secondary | ICD-10-CM | POA: Diagnosis not present

## 2024-07-08 DIAGNOSIS — G4733 Obstructive sleep apnea (adult) (pediatric): Secondary | ICD-10-CM

## 2024-07-08 DIAGNOSIS — G4730 Sleep apnea, unspecified: Secondary | ICD-10-CM | POA: Diagnosis not present

## 2024-07-08 DIAGNOSIS — G4710 Hypersomnia, unspecified: Secondary | ICD-10-CM | POA: Diagnosis not present

## 2024-07-09 NOTE — Procedures (Signed)
° ° °  SLEEP STUDY REPORT Patient Information Study Date: 07/08/2024 Patient Name: Sharon Acosta Patient ID: [id number] Birth Date: 1955-10-19 Age: 68 Gender: Female BMI: 46.0 (W=293 lb, H=5' 7'') Referring Physician: Newman Lawrence, MD  TEST DESCRIPTION: Home sleep apnea testing was completed using the WatchPat, a Type 1 device, utilizing peripheral arterial tonometry (PAT), chest movement, actigraphy, pulse oximetry, pulse rate, body position and snore. AHI was calculated with apnea and hypopnea using valid sleep time as the denominator. RDI includes apneas, hypopneas, and RERAs. The data acquired and the scoring of sleep and all associated events were performed in accordance with the recommended standards and specifications as outlined in the AASM Manual for the Scoring of Sleep and Associated Events 2.2.0 (2015).  FINDINGS: 1. No evidence of Obstructive Sleep Apnea with AHI 0.8/hr. 2. No Central Sleep Apnea. 3. Oxygen desaturations as low as 87%. 4. Mild snoring was present. O2 sats were < 88% for 1.8 minutes. 5. Total sleep time was 5 hrs and 57 min. 6. 19.2% of total sleep time was spent in REM sleep. 7. Normal sleep onset latency at 20 min. 8. Shortened REM sleep onset latency at 79 min. 9. Total awakenings were 2 10. Arrhythmia Detection: Possible Atrial Fibrillation lasting 5hr. 54 min and 53 secs. This is non diagnostic and recommend further testing if clinically indicated.  DIAGNOSIS: Normal study with no significant sleep disordered breathing. Possible Atrial Fibrillation  RECOMMENDATIONS: 1. Normal study with no significant sleep disordered breathing. 2. Healthy sleep recommendations include: adequate nightly sleep (normal 7-9 hrs/night), avoidance of caffeine after noon and alcohol near bedtime, and maintaining a sleep environment that is cool, dark and quiet. 3. Weight loss for overweight patients is recommended. 4. Snoring recommendations include: weight loss  where appropriate, side sleeping, and avoidance of alcohol before bed. 5. Operation of motor vehicle or dangerous equipment must be avoided when feeling drowsy, excessively sleepy, or mentally fatigued. 6. An ENT consultation which may be useful for specific causes of and possible treatment of bothersome snoring . 7. Weight loss may be of benefit in reducing the severity of snoring.    Signature: Wilbert Bihari, MD; [facility]; Diplomat, American Board of Sleep Medicine Electronically Signed: 07/09/2024 7:32:37 PM

## 2024-07-10 ENCOUNTER — Telehealth: Payer: Self-pay | Admitting: *Deleted

## 2024-07-10 NOTE — Telephone Encounter (Signed)
-----   Message from Wilbert Bihari, MD sent at 07/09/2024  7:35 PM EST ----- Please let patient know that sleep study showed no significant sleep apnea.

## 2024-07-10 NOTE — Telephone Encounter (Signed)
 The patient has been notified of the result via her mychart portal.

## 2024-07-21 NOTE — Progress Notes (Signed)
 This encounter was created in error - please disregard.

## 2024-07-21 NOTE — Procedures (Signed)
 Erroneous encounter

## 2024-07-27 LAB — LAB REPORT - SCANNED
Albumin, Urine POC: 105
Creatinine, POC: 110.5 mg/dL
EGFR: 57
HM HIV Screening: NEGATIVE
HM Hepatitis Screen: NEGATIVE
Microalb Creat Ratio: 95

## 2024-07-31 NOTE — Progress Notes (Unsigned)
 "  Chief Complaint: Discuss colonoscopy in a patient on chronic anticoagulation, Positive cologuard  HPI:    Sharon Acosta is a 69 year old female with a past medical history as listed below including A-fib on Eliquis  (05/07/2024 echo with LVEF 70-75%), who was referred to me by Barbra Odor, NP for consideration of colonoscopy due to positive cologuard.    04/08/2024 BMP with a creatinine of 1.29.  CBC with a hemoglobin of 17.3.    04/30/2024 positive Cologuard.    05/25/2024 BMP with a creatinine of 1.15.    05/26/24 CT angio chest aorta with and without contrast showed dilated aorta measuring up to 4.5 cm near the origin of the great vessels with mild calcified atherosclerotic disease.  Recommended semiannual imaging.  Enlarged pulmonary artery.  Upper lobe pulmonary nodule 2 mm.  Likely cholelithiasis and mild cardiomegaly.    No documented difficult intubation or airway.  Discussed the use of AI scribe software for clinical note transcription with the patient, who gave verbal consent to proceed.  History of Present Illness Sharon Acosta is a 69 year old female with atrial fibrillation who presents for evaluation of a positive Cologuard test.  Cologuard testing performed in October 2025 as part of her annual physical returned positive. She expresses anxiety regarding the result and the need for further testing. A prior non-Cologuard stool test at her primary care office was negative. She denies rectal bleeding, significant weight loss, abdominal pain, or changes in bowel habits. Bowel movements are regular.  The patient reports having atrial fibrillation, which she describes as 'real bad,' and states that her cardiologist manages her Eliquis . An electrocardiogram was performed in September 2025. She experiences exertional dyspnea, which she attributes to her body habitus and sedentary lifestyle.  Aneurysm has remained stable on serial imaging, including MRI. She is under surveillance by a  thoracic surgeon and expresses concern about the risk of rupture, but notes no change in the aneurysm over time.  She recently completed a home sleep apnea test, which was negative for sleep-disordered breathing.  Lifestyle is sedentary with difficulty ambulating and frequent sitting due to body habitus and physical limitations. She previously worked two jobs, including as a production designer, theatre/television/film at [employer], which she believes contributed to her current health status.  Denies fever, chills or weight loss.   Past Medical History:  Diagnosis Date   Acute on chronic heart failure with preserved ejection fraction (HCC) 01/2019   Anxiety    Atrial fibrillation (HCC) 01/2019   Hypertension    Pleural effusion     Past Surgical History:  Procedure Laterality Date   CARDIOVERSION N/A 03/25/2019   Procedure: CARDIOVERSION;  Surgeon: Elmira Newman PARAS, MD;  [facility];  Service: Cardiovascular;  Laterality: N/A;   CARDIOVERSION N/A 08/14/2022   Procedure: CARDIOVERSION;  Surgeon: Elmira Newman PARAS, MD;  Location: [facility];  Service: Cardiovascular;  Laterality: N/A;   WISDOM TOOTH EXTRACTION      Current Outpatient Medications  Medication Sig Dispense Refill   apixaban  (ELIQUIS ) 5 MG TABS tablet Take 1 tablet (5 mg total) by mouth 2 (two) times daily. 180 tablet 3   buPROPion (WELLBUTRIN XL) 300 MG 24 hr tablet Take 300 mg by mouth every morning.     Cholecalciferol (VITAMIN D3) 125 MCG (5000 UT) CAPS Take by mouth daily.     diltiazem  (CARDIZEM  CD) 360 MG 24 hr capsule Take 1 capsule (360 mg total) by mouth daily. 90 capsule 3   furosemide  (LASIX ) 40 MG tablet Take  1 tablet (40 mg total) by mouth as needed. 90 tablet 1   hydrALAZINE  (APRESOLINE ) 50 MG tablet Take 1 tablet (50 mg total) by mouth 3 (three) times daily. 270 tablet 3   isosorbide  dinitrate (ISORDIL ) 30 MG tablet Take 1 tablet (30 mg total) by mouth 3 (three) times daily. 270 tablet 3   losartan -hydrochlorothiazide  (HYZAAR) 50-12.5 MG tablet Take 1 tablet by mouth every morning. 90 tablet 3   Magnesium  250 MG TABS Take 250 mg by mouth daily with breakfast.     metoprolol  tartrate (LOPRESSOR ) 100 MG tablet Take 1 tablet (100 mg total) by mouth 2 (two) times daily. 180 tablet 3   POTASSIUM CHLORIDE  ER PO Take 1 tablet by mouth as needed (takes with lasix  as needed).     pyridOXINE (VITAMIN B-6) 100 MG tablet Take 100 mg by mouth daily with breakfast.     No current facility-administered medications for this visit.    Allergies as of 08/03/2024 - Review Complete 06/04/2024  Allergen Reaction Noted   Quinolones Other (See Comments) 06/04/2024    Family History  Problem Relation Age of Onset   Atrial fibrillation Mother    COPD Mother    Hypertension Father    Heart disease Sister    Heart disease Brother     Social History   Socioeconomic History   Marital status: Widowed    Spouse name: Not on file   Number of children: 3   Years of education: Not on file   Highest education level: Not on file  Occupational History   Not on file  Tobacco Use   Smoking status: Former    Current packs/day: 0.00    Average packs/day: 0.5 packs/day for 40.0 years (20.0 ttl pk-yrs)    Types: Cigarettes    Start date: 32    Quit date: 2023    Years since quitting: 3.0   Smokeless tobacco: Never  Vaping Use   Vaping status: Never Used  Substance and Sexual Activity   Alcohol use: Not Currently   Drug use: Never   Sexual activity: Not on file  Other Topics Concern   Not on file  Social History Narrative   Not on file   Social Drivers of Health   Tobacco Use: Medium Risk (06/04/2024)   Patient History    Smoking Tobacco Use: Former    Smokeless Tobacco Use: Never    Passive Exposure: Not on Actuary Strain: Not on file  Food Insecurity: Not on file  Transportation Needs: Not on file  Physical Activity: Not on file  Stress: Not on file  Social Connections: Not on file   Intimate Partner Violence: Not on file  Depression (PHQ2-9): Not on file  Alcohol Screen: Not on file  Housing: Not on file  Utilities: Not on file  Health Literacy: Not on file    Review of Systems:    Constitutional: No weight loss, fever or chills Skin: No rash Cardiovascular: No chest pain Respiratory: No SOB  Gastrointestinal: See HPI and otherwise negative Genitourinary: No dysuria  Neurological: No headache, dizziness or syncope Musculoskeletal: No new muscle or joint pain Hematologic: No bleeding  Psychiatric: No history of depression or anxiety   Physical Exam:  Vital signs: BP 130/80   Pulse 100   Ht 5' 7 (1.702 m)   Wt (!) 302 lb (137 kg)   BMI 47.30 kg/m    Constitutional:   Pleasant obese Caucasian female appears to be in NAD,  Well developed, Well nourished, alert and cooperative Head:  Normocephalic and atraumatic. Eyes:   PEERL, EOMI. No icterus. Conjunctiva pink. Ears:  Normal auditory acuity. Neck:  Supple Throat: Oral cavity and pharynx without inflammation, swelling or lesion.  Respiratory: Respirations even and unlabored. Lungs clear to auscultation bilaterally.   No wheezes, crackles, or rhonchi.  Cardiovascular: Normal S1, S2. No MRG. Regular rate and rhythm. No peripheral edema, cyanosis or pallor.  Gastrointestinal:  Soft, nondistended, nontender. No rebound or guarding. Normal bowel sounds. No appreciable masses or hepatomegaly. Rectal:  Not performed.  Msk:  Symmetrical without gross deformities. Without edema, no deformity or joint abnormality.  Neurologic:  Alert and  oriented x4;  grossly normal neurologically.  Skin:   Dry and intact without significant lesions or rashes. Psychiatric: Demonstrates good judgement and reason without abnormal affect or behaviors.  RELEVANT LABS AND IMAGING (and see HPI): CBC    Component Value Date/Time   WBC 9.3 04/08/2024 1024   WBC 10.2 02/24/2019 1059   RBC 5.65 (H) 04/08/2024 1024   RBC 5.42 (H)  02/24/2019 1059   HGB 17.3 (H) 04/08/2024 1024   HCT 53.0 (H) 04/08/2024 1024   PLT 280 04/08/2024 1024   MCV 94 04/08/2024 1024   MCH 30.6 04/08/2024 1024   MCH 30.3 02/24/2019 1059   MCHC 32.6 04/08/2024 1024   MCHC 33.1 02/24/2019 1059   RDW 13.8 04/08/2024 1024   LYMPHSABS 1.6 02/12/2019 1603   MONOABS 1.3 (H) 02/12/2019 1603   EOSABS 0.0 02/12/2019 1603   BASOSABS 0.1 02/12/2019 1603    CMP     Component Value Date/Time   NA 135 05/25/2024 1212   K 4.0 05/25/2024 1212   CL 96 05/25/2024 1212   CO2 22 05/25/2024 1212   GLUCOSE 82 05/25/2024 1212   GLUCOSE 102 (H) 08/14/2022 0714   BUN 19 05/25/2024 1212   CREATININE 1.15 (H) 05/25/2024 1212   CALCIUM 9.2 05/25/2024 1212   PROT 6.8 10/16/2022 1056   ALBUMIN 4.0 10/16/2022 1056   AST 21 10/16/2022 1056   ALT 28 10/16/2022 1056   ALKPHOS 82 10/16/2022 1056   BILITOT 0.6 10/16/2022 1056   GFRNONAA 58 (L) 05/30/2020 1119   GFRAA 67 05/30/2020 1119     Assessment & Plan Positive Cologuard test New, asymptomatic positive Cologuard result requiring further evaluation to exclude colonic polyps or malignancy. Colonoscopy is indicated for definitive diagnosis and potential polypectomy. Anticoagulation with Eliquis  increases procedural bleeding risk. - Educated her regarding the implications of a positive Cologuard, the rationale for colonoscopy. - Addressed anesthesia concerns and reassured her regarding the safety of moderate sedation. - Scheduled colonoscopy for diagnostic evaluation and polypectomy as indicated with Dr. Legrand in the [facility].  Did provide the patient a detailed list of risks of procedure and she agrees to proceed. Patient is appropriate for endoscopic procedure(s) in the ambulatory ([facility]) setting.  - Coordinated with her cardiologist regarding periprocedural management of Eliquis , with a plan to hold for two days prior to colonoscopy pending cardiology approval. - Discussed the requirement for a driver and the  need for scheduling flexibility to accommodate her support person.  AFIB on Eliquis   Patient to follow in clinic for recommendations after time procedure.  Assigned to Dr. Legrand today.   Delon Failing, PA-C [facility] 07/31/2024, 3:21 PM  Cc: Barbra Odor, NP  "

## 2024-08-03 ENCOUNTER — Encounter: Payer: Self-pay | Admitting: Physician Assistant

## 2024-08-03 ENCOUNTER — Ambulatory Visit: Admitting: Physician Assistant

## 2024-08-03 ENCOUNTER — Telehealth: Payer: Self-pay | Admitting: *Deleted

## 2024-08-03 VITALS — BP 130/80 | HR 100 | Ht 67.0 in | Wt 302.0 lb

## 2024-08-03 DIAGNOSIS — I4811 Longstanding persistent atrial fibrillation: Secondary | ICD-10-CM

## 2024-08-03 DIAGNOSIS — R195 Other fecal abnormalities: Secondary | ICD-10-CM

## 2024-08-03 DIAGNOSIS — Z7901 Long term (current) use of anticoagulants: Secondary | ICD-10-CM

## 2024-08-03 MED ORDER — NA SULFATE-K SULFATE-MG SULF 17.5-3.13-1.6 GM/177ML PO SOLN: 1.0000 | Freq: Once | ORAL | 0 refills | Status: AC

## 2024-08-03 NOTE — Telephone Encounter (Signed)
 Pharmacy please advise on holding Eliquis  prior to colonoscopy scheduled for 08/18/2024. Thank you.

## 2024-08-03 NOTE — Progress Notes (Signed)
 ____________________________________________________________  Attending physician addendum:  Thank you for sending this case to me. I have reviewed the entire note and agree with the plan.   Victory Brand, MD  ____________________________________________________________

## 2024-08-03 NOTE — Patient Instructions (Addendum)
 You will be contacted by our office prior to your procedure for directions on holding your Eliquis .  If you do not hear from our office 1 week prior to your scheduled procedure, please call [phone] to discuss.   You have been scheduled for a colonoscopy. Please follow written instructions given to you at your visit today.   If you use inhalers (even only as needed), please bring them with you on the day of your procedure.  DO NOT TAKE 7 DAYS PRIOR TO TEST- Trulicity (dulaglutide) Ozempic, Wegovy (semaglutide) Mounjaro, Zepbound (tirzepatide) Bydureon Bcise (exanatide extended release)  DO NOT TAKE 1 DAY PRIOR TO YOUR TEST Rybelsus (semaglutide) Adlyxin (lixisenatide) Victoza (liraglutide) Byetta (exanatide) ___________________________________________________________________________

## 2024-08-03 NOTE — Telephone Encounter (Signed)
 [facility] Pre-operative Risk Assessment     Request for surgical clearance:     Endoscopy Procedure  What type of surgery is being performed?     colonoscopy  When is this surgery scheduled?     08/18/24  What type of clearance is required ?   Pharmacy  Are there any medications that need to be held prior to surgery and how long? Eliquis  2 days  Practice name and name of physician performing surgery?      [facility]  What is your office phone and fax number?      Phone- [phone]  Fax- [phone]  Anesthesia type (None, local, MAC, general) ?       MAC   Please route your response to Powell Misty, CMA

## 2024-08-04 ENCOUNTER — Telehealth: Payer: Self-pay

## 2024-08-04 NOTE — Telephone Encounter (Signed)
"  °  Patient Consent for Virtual Visit        Sharon Acosta has provided verbal consent on 08/04/2024 for a virtual visit (video or telephone).   CONSENT FOR VIRTUAL VISIT FOR:  Sharon Acosta  By participating in this virtual visit I agree to the following:  I hereby voluntarily request, consent and authorize [facility] and its employed or contracted physicians, physician assistants, nurse practitioners or other licensed health care professionals (the Practitioner), to provide me with telemedicine health care services (the Services) as deemed necessary by the treating Practitioner. I acknowledge and consent to receive the Services by the Practitioner via telemedicine. I understand that the telemedicine visit will involve communicating with the Practitioner through live audiovisual communication technology and the disclosure of certain medical information by electronic transmission. I acknowledge that I have been given the opportunity to request an in-person assessment or other available alternative prior to the telemedicine visit and am voluntarily participating in the telemedicine visit.  I understand that I have the right to withhold or withdraw my consent to the use of telemedicine in the course of my care at any time, without affecting my right to future care or treatment, and that the Practitioner or I may terminate the telemedicine visit at any time. I understand that I have the right to inspect all information obtained and/or recorded in the course of the telemedicine visit and may receive copies of available information for a reasonable fee.  I understand that some of the potential risks of receiving the Services via telemedicine include:  Delay or interruption in medical evaluation due to technological equipment failure or disruption; Information transmitted may not be sufficient (e.g. poor resolution of images) to allow for appropriate medical decision making by the Practitioner;  and/or  In rare instances, security protocols could fail, causing a breach of personal health information.  Furthermore, I acknowledge that it is my responsibility to provide information about my medical history, conditions and care that is complete and accurate to the best of my ability. I acknowledge that Practitioner's advice, recommendations, and/or decision may be based on factors not within their control, such as incomplete or inaccurate data provided by me or distortions of diagnostic images or specimens that may result from electronic transmissions. I understand that the practice of medicine is not an exact science and that Practitioner makes no warranties or guarantees regarding treatment outcomes. I acknowledge that a copy of this consent can be made available to me via my patient portal [facility] MyChart), or I can request a printed copy by calling the office of [facility].    I understand that my insurance will be billed for this visit.   I have read or had this consent read to me. I understand the contents of this consent, which adequately explains the benefits and risks of the Services being provided via telemedicine.  I have been provided ample opportunity to ask questions regarding this consent and the Services and have had my questions answered to my satisfaction. I give my informed consent for the services to be provided through the use of telemedicine in my medical care    "

## 2024-08-04 NOTE — Telephone Encounter (Signed)
" ° °  Name: Sharon Acosta  DOB: Apr 15, 1956  MRN: [id number]  Primary Cardiologist: Newman JINNY Lawrence, MD  Preoperative team, please contact this patient and set up a phone call appointment for further preoperative risk assessment. Please obtain consent and complete medication review. Thank you for your help.  I confirm that guidance regarding antiplatelet and oral anticoagulation therapy has been completed and, if necessary, noted below.  Per office protocol, patient can hold Eliquis  for 2 days prior to procedure. -PharmD  I also confirmed the patient resides in the state of [place] . As per [facility] Medical Board telemedicine laws, the patient must reside in the state in which the provider is licensed.  Saddie GORMAN Cleaves, NP 08/04/2024, 11:39 AM [facility]    "

## 2024-08-04 NOTE — Telephone Encounter (Signed)
 Patient with diagnosis of A Fib on Eliquis  for anticoagulation.    Procedure: colonoscopy Date of procedure: 08/18/24   CHA2DS2-VASc Score = 4  This indicates a 4.8% annual risk of stroke. The patient's score is based upon: CHF History: 1 HTN History: 1 Diabetes History: 0 Stroke History: 0 Vascular Disease History: 0 Age Score: 1 Gender Score: 1   CrCl 68 ml/min using adj body weight Platelet count 280k  Patient  has not had an Afib/aflutter ablation in the last 3 months, DCCV within the last 4 weeks or a watchman implanted in the last 45 days    Per office protocol, patient can hold Eliquis  for 2 days prior to procedure.     **This guidance is not considered finalized until pre-operative APP has relayed final recommendations.**

## 2024-08-04 NOTE — Telephone Encounter (Signed)
 Preop tele appt now scheduled, med rec and consent done.

## 2024-08-10 ENCOUNTER — Ambulatory Visit: Attending: Cardiovascular Disease | Admitting: Emergency Medicine

## 2024-08-10 DIAGNOSIS — Z01810 Encounter for preprocedural cardiovascular examination: Secondary | ICD-10-CM | POA: Diagnosis not present

## 2024-08-10 NOTE — Progress Notes (Signed)
 "   Virtual Visit via Telephone Note   Because of Sharon Acosta co-morbid illnesses, she is at least at moderate risk for complications without adequate follow up.  This format is felt to be most appropriate for this patient at this time.  Due to technical limitations with video connection (technology), today's appointment will be conducted as an audio only telehealth visit, and Sharon Acosta verbally agreed to proceed in this manner.   All issues noted in this document were discussed and addressed.  No physical exam could be performed with this format.  Evaluation Performed:  Preoperative cardiovascular risk assessment _____________   Date:  08/10/2024   Patient ID:  Sharon Acosta, DOB 11-26-1955, MRN [id number] Patient Location:  Home Provider location:   Office  Primary Care Provider:  Barbra Odor, NP Primary Cardiologist:  Newman JINNY Lawrence, MD  Chief Complaint / Patient Profile   69 y.o. y/o female with a h/o hypertension, atrial fibrillation, obesity, HFpEF, ascending aortic aneurysm who is pending colonoscopy on 08/18/2024 with [facility] and presents today for telephonic preoperative cardiovascular risk assessment.  History of Present Illness    Sharon Acosta is a 69 y.o. female who presents via audio/video conferencing for a telehealth visit today.  Pt was last seen in cardiology clinic on 04/08/2024 by Dr. Lawrence.  At that time Sharon Acosta was doing well.  Sharon patient is now pending procedure as outlined above. Since her last visit, she is doing well without acute cardiovascular concerns or complaints.  She is without any chest pains or anginal symptoms.  She has chronic dyspnea on exertion which has been related to deconditioning/OHS.  No changes to her chronic DOE is noted.  She denies orthopnea or PND.  She is without any episodes of palpitations or elevated heart rates.  She is not very active but does maintain household chores, cooking, and climbs up  stairs daily without exertional chest pains.  She is able to reach greater than 4 METS.  No symptoms to suggest angina.  Past Medical History    Past Medical History:  Diagnosis Date   Acute on chronic heart failure with preserved ejection fraction (HCC) 01/2019   Anxiety    Atrial fibrillation (HCC) 01/2019   Hypertension    Pleural effusion    Past Surgical History:  Procedure Laterality Date   CARDIOVERSION N/A 03/25/2019   Procedure: CARDIOVERSION;  Surgeon: Lawrence Newman JINNY, MD;  [facility]: [facility];  Service: Cardiovascular;  Laterality: N/A;   CARDIOVERSION N/A 08/14/2022   Procedure: CARDIOVERSION;  Surgeon: Lawrence Newman JINNY, MD;  Location: [facility];  Service: Cardiovascular;  Laterality: N/A;   WISDOM TOOTH EXTRACTION      Allergies  Allergies[1]  Home Medications    Prior to Admission medications  Medication Sig Start Date End Date Taking? Authorizing Provider  apixaban  (ELIQUIS ) 5 MG TABS tablet Take 1 tablet (5 mg total) by mouth 2 (two) times daily. 04/08/24   Patwardhan, Newman JINNY, MD  buPROPion (WELLBUTRIN XL) 300 MG 24 hr tablet Take 300 mg by mouth every morning. 05/12/22   [provider]  Cholecalciferol (VITAMIN D3) 125 MCG (5000 UT) CAPS Take by mouth daily.    [provider]  diltiazem  (CARDIZEM  CD) 360 MG 24 hr capsule Take 1 capsule (360 mg total) by mouth daily. 04/08/24   Patwardhan, Newman JINNY, MD  furosemide  (LASIX ) 40 MG tablet Take 1 tablet (40 mg total) by mouth as needed. 04/08/24   Patwardhan, Newman JINNY, MD  hydrALAZINE  (APRESOLINE )  50 MG tablet Take 1 tablet (50 mg total) by mouth 3 (three) times daily. 04/08/24   Patwardhan, Newman PARAS, MD  isosorbide  dinitrate (ISORDIL ) 30 MG tablet Take 1 tablet (30 mg total) by mouth 3 (three) times daily. 04/08/24   Patwardhan, Newman PARAS, MD  losartan -hydrochlorothiazide (HYZAAR) 50-12.5 MG tablet Take 1 tablet by mouth every morning. 04/08/24   Patwardhan, Newman PARAS, MD  Magnesium  250 MG  TABS Take 250 mg by mouth daily with breakfast. Patient taking differently: Take 250 mg by mouth daily with breakfast. Pt taking every other day    [provider]  metoprolol  tartrate (LOPRESSOR ) 100 MG tablet Take 1 tablet (100 mg total) by mouth 2 (two) times daily. 04/08/24   Patwardhan, Newman PARAS, MD  POTASSIUM CHLORIDE  ER PO Take 1 tablet by mouth as needed (takes with lasix  as needed).    [provider]  pyridOXINE (VITAMIN B-6) 100 MG tablet Take 100 mg by mouth daily with breakfast.    [provider]    Physical Exam    Vital Signs:  Sharon Acosta does not have vital signs available for review today.  Given telephonic nature of communication, physical exam is limited. AAOx3. NAD. Normal affect.  Speech and respirations are unlabored.  Accessory Clinical Findings    None  Assessment & Plan    1.  Preoperative Cardiovascular Risk Assessment: According to Sharon Revised Cardiac Risk Index (RCRI), her Perioperative Risk of Major Cardiac Event is (%): 0.9. Her Functional Capacity in METs is: 5.07 according to Sharon Duke Activity Status Index (DASI).  Therefore, based on ACC/AHA guidelines, patient would be at acceptable risk for Sharon planned procedure without further cardiovascular testing. I will route this recommendation to Sharon requesting party via Epic fax function.  Sharon patient was advised that if she develops new symptoms prior to surgery to contact our office to arrange for a follow-up visit, and she verbalized understanding.  Per office protocol, patient can hold Eliquis  for 2 days prior to procedure.    A copy of this note will be routed to requesting surgeon.  Time:   Today, I have spent 14 minutes with Sharon patient with telehealth technology discussing medical history, symptoms, and management plan.     Lum LITTIE Louis, NP  08/10/2024, 2:17 PM     [1]  Allergies Allergen Reactions   Quinolones Other (See Comments)    Ascending thoracic  aortic aneurysm, use with caution   "

## 2024-08-11 NOTE — Telephone Encounter (Signed)
 Patient is aware she she may hold Eliquis .

## 2024-08-18 ENCOUNTER — Encounter: Admitting: Gastroenterology

## 2024-08-18 ENCOUNTER — Telehealth: Payer: Self-pay | Admitting: *Deleted

## 2024-08-18 ENCOUNTER — Telehealth: Payer: Self-pay | Admitting: Physician Assistant

## 2024-08-18 NOTE — Telephone Encounter (Signed)
 PT had to reschedule procedure due to the weather and needs to have new instructions sent to mychart to reflect the change. Please advise.

## 2024-08-18 NOTE — Telephone Encounter (Signed)
 Spoke with pt. She had already called and r/s for 09/23/24, d/t snow. Pt requested new instructions to be sent to her MyChart. Will send new instructions including when to hold Eliquis .

## 2024-08-28 NOTE — Telephone Encounter (Signed)
New instructions sent via [place].

## 2024-09-23 ENCOUNTER — Encounter: Admitting: Gastroenterology
